# Patient Record
Sex: Female | Born: 1980 | Race: Asian | Hispanic: No | Marital: Married | Smoking: Never smoker
Health system: Southern US, Community
[De-identification: ages and names within clinical notes are randomized; demographics above are authoritative.]

## PROBLEM LIST (undated history)

## (undated) ENCOUNTER — Inpatient Hospital Stay (HOSPITAL_COMMUNITY): Payer: Self-pay

## (undated) DIAGNOSIS — Z789 Other specified health status: Secondary | ICD-10-CM

## (undated) HISTORY — PX: APPENDECTOMY: SHX54

---

## 2006-12-30 ENCOUNTER — Emergency Department (HOSPITAL_COMMUNITY): Admission: EM | Admit: 2006-12-30 | Discharge: 2006-12-30 | Payer: Self-pay | Admitting: Emergency Medicine

## 2007-09-17 ENCOUNTER — Encounter: Admission: RE | Admit: 2007-09-17 | Discharge: 2007-09-17 | Payer: Self-pay | Admitting: Internal Medicine

## 2007-11-25 ENCOUNTER — Emergency Department (HOSPITAL_COMMUNITY): Admission: EM | Admit: 2007-11-25 | Discharge: 2007-11-25 | Payer: Self-pay | Admitting: Emergency Medicine

## 2008-05-04 ENCOUNTER — Encounter: Admission: RE | Admit: 2008-05-04 | Discharge: 2008-05-04 | Payer: Self-pay | Admitting: Internal Medicine

## 2008-11-12 ENCOUNTER — Emergency Department (HOSPITAL_COMMUNITY): Admission: EM | Admit: 2008-11-12 | Discharge: 2008-11-13 | Payer: Self-pay | Admitting: Emergency Medicine

## 2010-08-13 LAB — COMPREHENSIVE METABOLIC PANEL
Alkaline Phosphatase: 23 U/L — ABNORMAL LOW (ref 39–117)
BUN: 7 mg/dL (ref 6–23)
Chloride: 109 mEq/L (ref 96–112)
Creatinine, Ser: 0.68 mg/dL (ref 0.4–1.2)
Glucose, Bld: 81 mg/dL (ref 70–99)
Potassium: 3.3 mEq/L — ABNORMAL LOW (ref 3.5–5.1)
Total Bilirubin: 1.2 mg/dL (ref 0.3–1.2)
Total Protein: 6.1 g/dL (ref 6.0–8.3)

## 2010-08-13 LAB — CULTURE, BLOOD (ROUTINE X 2)

## 2010-08-13 LAB — CBC
HCT: 34.8 % — ABNORMAL LOW (ref 36.0–46.0)
Hemoglobin: 11.8 g/dL — ABNORMAL LOW (ref 12.0–15.0)
MCV: 88.8 fL (ref 78.0–100.0)
RDW: 12.6 % (ref 11.5–15.5)

## 2010-08-13 LAB — DIFFERENTIAL
Basophils Absolute: 0 10*3/uL (ref 0.0–0.1)
Basophils Relative: 1 % (ref 0–1)
Neutro Abs: 2.7 10*3/uL (ref 1.7–7.7)
Neutrophils Relative %: 50 % (ref 43–77)

## 2011-02-16 LAB — POCT PREGNANCY, URINE
Operator id: 235561
Preg Test, Ur: NEGATIVE

## 2011-02-16 LAB — CBC
Hemoglobin: 13.2
RBC: 4.51
RDW: 11.4 — ABNORMAL LOW

## 2011-02-16 LAB — POCT URINALYSIS DIP (DEVICE)
Bilirubin Urine: NEGATIVE
Glucose, UA: NEGATIVE
Nitrite: NEGATIVE
Operator id: 116391
pH: 7

## 2011-02-16 LAB — I-STAT 8, (EC8 V) (CONVERTED LAB)
Acid-Base Excess: 2
Chloride: 102
HCT: 45
Hemoglobin: 15.3 — ABNORMAL HIGH
Potassium: 3.5
pH, Ven: 7.328 — ABNORMAL HIGH

## 2011-02-16 LAB — DIFFERENTIAL
Basophils Absolute: 0
Lymphocytes Relative: 35
Monocytes Absolute: 0.4
Neutro Abs: 3.8

## 2011-02-16 LAB — POCT I-STAT CREATININE: Operator id: 235561

## 2013-07-24 ENCOUNTER — Ambulatory Visit: Payer: BC Managed Care – PPO | Attending: Internal Medicine | Admitting: Internal Medicine

## 2013-07-24 VITALS — BP 106/73 | HR 64 | Temp 98.1°F | Resp 16 | Ht 65.0 in | Wt 130.0 lb

## 2013-07-24 DIAGNOSIS — M549 Dorsalgia, unspecified: Secondary | ICD-10-CM | POA: Diagnosis not present

## 2013-07-24 DIAGNOSIS — M545 Low back pain, unspecified: Secondary | ICD-10-CM | POA: Insufficient documentation

## 2013-07-24 DIAGNOSIS — Z008 Encounter for other general examination: Secondary | ICD-10-CM | POA: Insufficient documentation

## 2013-07-24 DIAGNOSIS — IMO0002 Reserved for concepts with insufficient information to code with codable children: Secondary | ICD-10-CM

## 2013-07-24 DIAGNOSIS — N979 Female infertility, unspecified: Secondary | ICD-10-CM

## 2013-07-24 LAB — COMPLETE METABOLIC PANEL WITH GFR
ALBUMIN: 4.2 g/dL (ref 3.5–5.2)
ALK PHOS: 29 U/L — AB (ref 39–117)
ALT: 9 U/L (ref 0–35)
AST: 14 U/L (ref 0–37)
BUN: 12 mg/dL (ref 6–23)
CALCIUM: 9 mg/dL (ref 8.4–10.5)
CHLORIDE: 101 meq/L (ref 96–112)
CO2: 27 mEq/L (ref 19–32)
Creat: 0.64 mg/dL (ref 0.50–1.10)
GFR, Est African American: >89 mL/min
GFR, Est Non African American: >89 mL/min
Glucose, Bld: 82 mg/dL (ref 70–99)
POTASSIUM: 4.2 meq/L (ref 3.5–5.3)
Sodium: 139 mEq/L (ref 135–145)
Total Bilirubin: 0.6 mg/dL (ref 0.2–1.2)
Total Protein: 6.8 g/dL (ref 6.0–8.3)

## 2013-07-24 LAB — CBC WITH DIFFERENTIAL/PLATELET
BASOS ABS: 0.1 10*3/uL (ref 0.0–0.1)
BASOS PCT: 1 % (ref 0–1)
EOS ABS: 0.1 10*3/uL (ref 0.0–0.7)
Eosinophils Relative: 1 % (ref 0–5)
HCT: 40.3 % (ref 36.0–46.0)
Hemoglobin: 13.7 g/dL (ref 12.0–15.0)
Lymphocytes Relative: 34 % (ref 12–46)
Lymphs Abs: 1.8 10*3/uL (ref 0.7–4.0)
MCH: 29.1 pg (ref 26.0–34.0)
MCHC: 34 g/dL (ref 30.0–36.0)
MCV: 85.6 fL (ref 78.0–100.0)
Monocytes Absolute: 0.3 10*3/uL (ref 0.1–1.0)
Monocytes Relative: 6 % (ref 3–12)
NEUTROS PCT: 58 % (ref 43–77)
Neutro Abs: 3.1 10*3/uL (ref 1.7–7.7)
PLATELETS: 177 10*3/uL (ref 150–400)
RBC: 4.71 MIL/uL (ref 3.87–5.11)
RDW: 13 % (ref 11.5–15.5)
WBC: 5.3 10*3/uL (ref 4.0–10.5)

## 2013-07-24 LAB — LIPID PANEL
Cholesterol: 178 mg/dL (ref 0–200)
HDL: 46 mg/dL (ref >39)
LDL CALC: 116 mg/dL — AB (ref 0–99)
Total CHOL/HDL Ratio: 3.9 Ratio
Triglycerides: 80 mg/dL (ref <150)
VLDL: 16 mg/dL (ref 0–40)

## 2013-07-24 MED ORDER — CYCLOBENZAPRINE HCL 10 MG PO TABS: 10.0000 mg | ORAL_TABLET | Freq: Every day | ORAL | Status: DC

## 2013-07-24 NOTE — Progress Notes (Signed)
Patient ID: Laura Warren, female   DOB: [birth year], 33 y.o.   MRN: [id number]   CC:  HPI: 33 year old female here to establish care. She has no known medical history. The patient has a history of an appendectomy. She has a 75-year-old daughter and stated that she has been trying to conceive for the last one year. Her menstrual cycles are regular. She does complain of some back pain which is intermittent, worse during her menstrual cycle, she did have exacerbations even when she's not menstruating. Denies any numbness or tingling in her legs. Pain is localized to her lumbar spine. She has been taking Aleve occasionally for back pain.   Patient is a nonsmoker nonalcoholic  Family history mother died of thyroid cancer, father is healthy   No Known Allergies History reviewed. No pertinent past medical history. No current outpatient prescriptions on file prior to visit.   No current facility-administered medications on file prior to visit.   Family History  Problem Relation Age of Onset  . Cancer Mother    History   Social History  . Marital Status: Married    Spouse Name: N/A    Number of Children: N/A  . Years of Education: N/A   Occupational History  . Not on file.   Social History Main Topics  . Smoking status: Never Smoker   . Smokeless tobacco: Not on file  . Alcohol Use: No  . Drug Use: No  . Sexual Activity: Yes   Other Topics Concern  . Not on file   Social History Narrative  . No narrative on file    Review of Systems  Constitutional: Negative for fever, chills, diaphoresis, activity change, appetite change and fatigue.  HENT: Negative for ear pain, nosebleeds, congestion, facial swelling, rhinorrhea, neck pain, neck stiffness and ear discharge.   Eyes: Negative for pain, discharge, redness, itching and visual disturbance.  Respiratory: Negative for cough, choking, chest tightness, shortness of breath, wheezing and stridor.   Cardiovascular: Negative for  chest pain, palpitations and leg swelling.  Gastrointestinal: Negative for abdominal distention.  Genitourinary: Negative for dysuria, urgency, frequency, hematuria, flank pain, decreased urine volume, difficulty urinating and dyspareunia.  Musculoskeletal: Negative for back pain, joint swelling, arthralgias and gait problem.  Neurological: Negative for dizziness, tremors, seizures, syncope, facial asymmetry, speech difficulty, weakness, light-headedness, numbness and headaches.  Hematological: Negative for adenopathy. Does not bruise/bleed easily.  Psychiatric/Behavioral: Negative for hallucinations, behavioral problems, confusion, dysphoric mood, decreased concentration and agitation.    Objective:   Filed Vitals:   07/24/13 0908  BP: 106/73  Pulse: 64  Temp: 98.1 F (36.7 C)  Resp: 16    Physical Exam  Constitutional: Appears well-developed and well-nourished. No distress.  HENT: Normocephalic. External right and left ear normal. Oropharynx is clear and moist.  Eyes: Conjunctivae and EOM are normal. PERRLA, no scleral icterus.  Neck: Normal ROM. Neck supple. No JVD. No tracheal deviation. No thyromegaly.  CVS: RRR, S1/S2 +, no murmurs, no gallops, no carotid bruit.  Pulmonary: Effort and breath sounds normal, no stridor, rhonchi, wheezes, rales.  Abdominal: Soft. BS +,  no distension, tenderness, rebound or guarding.  Musculoskeletal: Normal range of motion. No edema and no tenderness.  Lymphadenopathy: No lymphadenopathy noted, cervical, inguinal. Neuro: Alert. Normal reflexes, muscle tone coordination. No cranial nerve deficit. Skin: Skin is warm and dry. No rash noted. Not diaphoretic. No erythema. No pallor.  Psychiatric: Normal mood and affect. Behavior, judgment, thought content normal.   Lab Results  Component Value Date  WBC 5.4 11/12/2008   HGB 11.8* 11/12/2008   HCT 34.8* 11/12/2008   MCV 88.8 11/12/2008   PLT 125* 11/12/2008   Lab Results  Component Value Date    CREATININE 0.68 11/12/2008   BUN 7 11/12/2008   NA 140 11/12/2008   K 3.3* 11/12/2008   CL 109 11/12/2008   CO2 28 11/12/2008    No results found for this basename: HGBA1C   Lipid Panel  No results found for this basename: chol, trig, hdl, cholhdl, vldl, ldlcalc       Assessment and plan:   There are no active problems to display for this patient.  Back pain Skin clear x-ray of the back Patient advised to take Tylenol if needed for pain Flexeril as bedtime These medications are safe during pregnancy, in case she gets pregnant  Establish care Gynecology referral for infertility and Pap smear Baseline labs Patient states that she received tetanus in 2008 Flu Vaccine last year  Followup in 3 months       The patient was given clear instructions to go to ER or return to medical center if symptoms don't improve, worsen or new problems develop. The patient verbalized understanding. The patient was told to call to get any lab results if not heard anything in the next week.

## 2013-07-24 NOTE — Progress Notes (Signed)
Pt us here to establish care. Pt is requesting a full check up. For a year pt has been trying to get pregnant. She get back pain and shoulder pain when she is having her menstrual cycle.

## 2013-07-25 LAB — VITAMIN D 25 HYDROXY (VIT D DEFICIENCY, FRACTURES): Vit D, 25-Hydroxy: 23 ng/mL — ABNORMAL LOW (ref 30–89)

## 2013-07-25 LAB — PROLACTIN: PROLACTIN: 10.2 ng/mL

## 2013-07-25 LAB — TSH: TSH: 1.066 u[IU]/mL (ref 0.350–4.500)

## 2013-07-25 LAB — T4, FREE: FREE T4: 1.39 ng/dL (ref 0.80–1.80)

## 2013-08-03 ENCOUNTER — Telehealth: Payer: Self-pay | Admitting: Emergency Medicine

## 2013-08-03 NOTE — Telephone Encounter (Signed)
Message copied by Darlis LoanSMITH, JILL D on Mon Aug 03, 2013  5:20 PM ------      Message from: Susie CassetteABROL MD, Germain OsgoodNAYANA      Created: Wed Jul 29, 2013  4:10 PM       Notify patient that labs are normal, vitamin D level slightly low. Recommend vitamin D. 2000 international units over-the-counter twice a day ------

## 2013-08-03 NOTE — Telephone Encounter (Signed)
Left message for pt to call clinic

## 2013-11-26 ENCOUNTER — Ambulatory Visit (HOSPITAL_COMMUNITY)
Admission: RE | Admit: 2013-11-26 | Discharge: 2013-11-26 | Disposition: A | Discharge status: Home / Self Care | Payer: Worker's Compensation | Source: Ambulatory Visit | Attending: Internal Medicine | Admitting: Internal Medicine

## 2013-11-26 ENCOUNTER — Other Ambulatory Visit (HOSPITAL_COMMUNITY): Payer: Self-pay | Admitting: Internal Medicine

## 2013-11-26 DIAGNOSIS — S0990XA Unspecified injury of head, initial encounter: Secondary | ICD-10-CM | POA: Insufficient documentation

## 2013-11-26 DIAGNOSIS — R42 Dizziness and giddiness: Secondary | ICD-10-CM | POA: Insufficient documentation

## 2013-11-26 DIAGNOSIS — X58XXXA Exposure to other specified factors, initial encounter: Secondary | ICD-10-CM | POA: Insufficient documentation

## 2013-11-26 DIAGNOSIS — R51 Headache: Secondary | ICD-10-CM | POA: Insufficient documentation

## 2014-09-03 ENCOUNTER — Encounter: Payer: Self-pay | Admitting: Family Medicine

## 2014-09-03 ENCOUNTER — Ambulatory Visit: Payer: PRIVATE HEALTH INSURANCE | Attending: Family Medicine | Admitting: Family Medicine

## 2014-09-03 VITALS — BP 99/62 | HR 72 | Temp 98.3°F | Resp 16 | Ht 65.0 in | Wt 132.0 lb

## 2014-09-03 DIAGNOSIS — M545 Low back pain, unspecified: Secondary | ICD-10-CM

## 2014-09-03 DIAGNOSIS — N979 Female infertility, unspecified: Secondary | ICD-10-CM | POA: Insufficient documentation

## 2014-09-03 MED ORDER — DICLOFENAC SODIUM 1 % TD GEL: 2.0000 g | Freq: Four times a day (QID) | TRANSDERMAL | Status: DC

## 2014-09-03 MED ORDER — GABAPENTIN 300 MG PO CAPS: 300.0000 mg | ORAL_CAPSULE | Freq: Every day | ORAL | Status: DC

## 2014-09-03 NOTE — Progress Notes (Signed)
Establish Care Complaining  of back pain Question about infertility

## 2014-09-03 NOTE — Patient Instructions (Addendum)
Mrs. Laura Warren,   1. Back pain: I suspect L 4 nerve root irritation related to joint space narrowing Plan: Evaluate with x-ray  Diclofenac- antiinflammatory gel as oral NSAIDs are not recommended in pregnancy  Gabapentin 300 mg at night Physical therapy    2. Trouble conceiving: With a regular 28 day cycle be sure to have sex on your fertile days   F/u in 4-6 weeks for full physical with pap, 30 minute visit   Dr. Armen PickupFunches

## 2014-09-03 NOTE — Progress Notes (Signed)
   Subjective:    Patient ID: Laura Warren, female    DOB: 01/01/1981, 34 y.o.   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 Trouble conceiving: has one child. Would like another child. Trying now for 2 years with her husband. Has periods q 28 days. No contraception for many years, pull out method only. Husband is healthy. Has sex 2-3 times per week.   Soc Hx: non smoker Med Hx: none Surg Hx; appendectomy in 1996 Review of Systems  Constitutional: Negative.   Genitourinary: Negative for menstrual problem.  Musculoskeletal: Positive for back pain.  Allergic/Immunologic: Negative.   Psychiatric/Behavioral: Negative.        Objective:   Physical Exam BP 99/62 mmHg  Pulse 72  Temp(Src) 98.3 F (36.8 C) (Oral)  Resp 16  Ht 5\' 5"  (1.651 m)  Wt 132 lb (59.875 kg)  BMI 21.97 kg/m2  SpO2 99%  LMP 09/01/2014 General appearance: alert, cooperative and no distress Lungs: clear to auscultation bilaterally Heart: regular rate and rhythm, S1, S2 normal, no murmur, click, rub or gallop  Back Exam: Back: Normal Curvature, no deformities or CVA tenderness  Paraspinal Tenderness: b/l L5  LE Strength 5/5  LE Sensation: in tact  LE Reflexes 2+ and symmetric  Straight leg raise: negative          Assessment & Plan:

## 2014-09-04 ENCOUNTER — Encounter: Payer: Self-pay | Admitting: Family Medicine

## 2014-09-04 NOTE — Assessment & Plan Note (Signed)
A:  Trouble conceiving: after 2 yrs of trying with regular periods P: With a regular 28 day cycle be sure to have sex on your fertile days,fertility wheel provided Pelvic exam at f/u

## 2014-09-04 NOTE — Assessment & Plan Note (Signed)
A: Back pain: I suspect L 4 nerve root irritation related to joint space narrowing Plan: Evaluate with x-ray  Diclofenac- antiinflammatory gel as oral NSAIDs are not recommended in pregnancy  Gabapentin 300 mg at night Physical therapy

## 2015-07-26 ENCOUNTER — Encounter: Payer: Self-pay | Admitting: Family Medicine

## 2015-07-26 ENCOUNTER — Other Ambulatory Visit (HOSPITAL_COMMUNITY)
Admission: RE | Admit: 2015-07-26 | Discharge: 2015-07-26 | Disposition: A | Discharge status: Home / Self Care | Payer: PRIVATE HEALTH INSURANCE | Source: Ambulatory Visit | Attending: Family Medicine | Admitting: Family Medicine

## 2015-07-26 ENCOUNTER — Ambulatory Visit: Payer: PRIVATE HEALTH INSURANCE | Attending: Family Medicine | Admitting: Family Medicine

## 2015-07-26 VITALS — BP 109/72 | HR 74 | Temp 98.3°F | Resp 16 | Ht 65.0 in | Wt 129.0 lb

## 2015-07-26 DIAGNOSIS — N760 Acute vaginitis: Secondary | ICD-10-CM | POA: Diagnosis not present

## 2015-07-26 DIAGNOSIS — B9689 Other specified bacterial agents as the cause of diseases classified elsewhere: Secondary | ICD-10-CM

## 2015-07-26 DIAGNOSIS — N979 Female infertility, unspecified: Secondary | ICD-10-CM

## 2015-07-26 DIAGNOSIS — Z01419 Encounter for gynecological examination (general) (routine) without abnormal findings: Secondary | ICD-10-CM | POA: Insufficient documentation

## 2015-07-26 DIAGNOSIS — Z1151 Encounter for screening for human papillomavirus (HPV): Secondary | ICD-10-CM | POA: Diagnosis present

## 2015-07-26 DIAGNOSIS — Z124 Encounter for screening for malignant neoplasm of cervix: Secondary | ICD-10-CM | POA: Diagnosis present

## 2015-07-26 DIAGNOSIS — A499 Bacterial infection, unspecified: Secondary | ICD-10-CM | POA: Diagnosis not present

## 2015-07-26 DIAGNOSIS — Z113 Encounter for screening for infections with a predominantly sexual mode of transmission: Secondary | ICD-10-CM | POA: Diagnosis present

## 2015-07-26 NOTE — Progress Notes (Signed)
Annual gyn  No vaginal discharge no odor  sexually active, no pain with intercourse  No tobacco user  No suicidal thoughts in the past two weeks

## 2015-07-26 NOTE — Patient Instructions (Addendum)
Laura Warren was seen today for annual exam.  Diagnoses and all orders for this visit:  Pap smear for cervical cancer screening -     Cytology - PAP  Inability to conceive, female -     Cancel: FSH/LH; Future -     Cancel: Estradiol; Future -     Ambulatory referral to Endocrinology   F/u in 6 months for wellness  You will be contacted about referral to reproductive endocrinologist  Dr. Armen PickupFunches

## 2015-07-26 NOTE — Progress Notes (Signed)
SUBJECTIVE:  35 y.o. female for annual routine Pap and checkup.  1. Inability to conceive: trying now for 3 years. Has one daughter with her husband. She is 189 yrs old. Patient conceived right away.  Periods are lighter and shorter, 2 days. However the interval between periods has been regular.   Current Outpatient Prescriptions  Medication Sig Dispense Refill  . diclofenac sodium (VOLTAREN) 1 % GEL Apply 2 g topically 4 (four) times daily. (Patient not taking: Reported on 07/26/2015) 100 g 3  . gabapentin (NEURONTIN) 300 MG capsule Take 1 capsule (300 mg total) by mouth at bedtime. (Patient not taking: Reported on 07/26/2015) 30 capsule 1   No current facility-administered medications for this visit.   Allergies: Review of patient's allergies indicates no known allergies.  Patient's last menstrual period was 07/19/2015.  ROS:  Feeling well. No dyspnea or chest pain on exertion.  No abdominal pain, change in bowel habits, black or bloody stools.  No urinary tract symptoms. GYN ROS: normal menses, no abnormal bleeding, pelvic pain or discharge, no breast pain or new or enlarging lumps on self exam, no side effects of hormonal medications, no vaginal bleeding, no discharge or pelvic pain. No neurological complaints.  OBJECTIVE:  The patient appears well, alert, oriented x 3, in no distress. BP 109/72 mmHg  Pulse 74  Temp(Src) 98.3 F (36.8 C) (Oral)  Resp 16  Ht 5\' 5"  (1.651 m)  Wt 129 lb (58.514 kg)  BMI 21.47 kg/m2  SpO2 99%  LMP 07/19/2015 ENT normal.  Neck supple. No adenopathy or thyromegaly. PERLA. Lungs are clear, good air entry, no wheezes, rhonchi or rales. S1 and S2 normal, no murmurs, regular rate and rhythm. Abdomen soft without tenderness, guarding, mass or organomegaly. Extremities show no edema, normal peripheral pulses. Neurological is normal, no focal findings.  BREAST EXAM: breasts appear normal, no suspicious masses, no skin or nipple changes or axillary nodes  PELVIC  EXAM: normal external genitalia, vulva, vagina, anterior lip of cervix is friable, uterus and adnexa normal  ASSESSMENT:  well woman  PLAN:  pap smear Referred to reproductive endocrinologist

## 2015-07-27 LAB — CERVICOVAGINAL ANCILLARY ONLY
CHLAMYDIA, DNA PROBE: NEGATIVE
NEISSERIA GONORRHEA: NEGATIVE

## 2015-07-28 ENCOUNTER — Encounter: Payer: Self-pay | Admitting: Clinical

## 2015-07-28 LAB — CYTOLOGY - PAP

## 2015-07-28 NOTE — Progress Notes (Signed)
[facility] 07/26/2015 09/03/2014  Decreased Interest 1 0  Down, Depressed, Hopeless 0 0  PHQ - 2 Score 1 0  Altered sleeping 1 -  Tired, decreased energy 0 -  Change in appetite 0 -  Feeling bad or failure about yourself  0 -  Trouble concentrating 0 -  Moving slowly or fidgety/restless 0 -  Suicidal thoughts 0 -  PHQ-9 Score 2 -    GAD 7 : Generalized Anxiety Score 07/26/2015  Nervous, Anxious, on Edge 0  Control/stop worrying 0  Worry too much - different things 1  Trouble relaxing 0  Restless 0  Easily annoyed or irritable 0  Afraid - awful might happen 0  Total GAD 7 Score 1

## 2015-08-01 LAB — CERVICOVAGINAL ANCILLARY ONLY: WET PREP (BD AFFIRM): POSITIVE — AB

## 2015-08-01 MED ORDER — METRONIDAZOLE 500 MG PO TABS: 500.0000 mg | ORAL_TABLET | Freq: Two times a day (BID) | ORAL | Status: AC

## 2015-08-01 MED ORDER — FLUCONAZOLE 150 MG PO TABS: 150.0000 mg | ORAL_TABLET | Freq: Once | ORAL | Status: DC

## 2015-08-01 NOTE — Addendum Note (Signed)
Addended by: Dessa PhiFUNCHES, Khristopher Kapaun on: 08/01/2015 02:06 PM   Modules accepted: Orders, SmartSet

## 2015-08-04 ENCOUNTER — Telehealth: Payer: Self-pay | Admitting: *Deleted

## 2015-08-04 NOTE — Telephone Encounter (Signed)
-----   Message from Dessa PhiJosalyn Funches, MD sent at 07/28/2015  2:47 PM EDT ----- Negative pap that is HPV negative This is normal Repeat in 5 years

## 2015-08-04 NOTE — Telephone Encounter (Signed)
Pt return call  Date of birth verified by pt Lab results given  Normal Pap, Gc/ chlam negative BV on wet  Rx send to [facility]  Pt verbalized understanding

## 2015-08-04 NOTE — Telephone Encounter (Signed)
-----   Message from Dessa PhiJosalyn Funches, MD sent at 08/01/2015  2:05 PM EDT ----- BV on wet prep Bacterial vaginosis (BV) on wet prep This is not an STD This is an overgrowth of bacteria that can be treated with flagyl 500 mg twice daily followed by difucan 150 mg once to prevent yeast.  I have sent these to your pharmacy Do not mix flagyl with alcohol as this will cause stomach upset

## 2015-08-04 NOTE — Telephone Encounter (Signed)
LVM to return call.

## 2015-08-04 NOTE — Telephone Encounter (Signed)
-----   Message from Dessa PhiJosalyn Funches, MD sent at 07/28/2015 10:27 AM EDT ----- Negative screening GC/chlam

## 2015-09-20 ENCOUNTER — Telehealth: Payer: Self-pay | Admitting: Family Medicine

## 2015-09-20 DIAGNOSIS — N979 Female infertility, unspecified: Secondary | ICD-10-CM

## 2015-09-20 NOTE — Telephone Encounter (Signed)
Referral was placed on 3.21 . 2017 to gyn who recommended reproductive gyn There is no option for reproductive gyn referral in [facility], I have placed another gyn referral

## 2015-09-20 NOTE — Telephone Encounter (Signed)
Patient is following up regarding referral to specialist for fertility concerns  Patient states that it has been over two months and she has not heard back from anyone  Please follow up

## 2015-11-11 ENCOUNTER — Telehealth: Payer: Self-pay | Admitting: Family Medicine

## 2015-11-11 DIAGNOSIS — Z3490 Encounter for supervision of normal pregnancy, unspecified, unspecified trimester: Secondary | ICD-10-CM

## 2015-11-11 NOTE — Telephone Encounter (Signed)
Patient dropped off paperwork for FMLA from job. Patient states that she is two months pregnant and experiencing a lot of nausea, vomiting and is unable to lift. Please follow up. Patient needs paperwork as soon as possible.

## 2015-11-11 NOTE — Telephone Encounter (Signed)
Will forward message to Dr. Armen PickupFunches to see what she advise and see what's the status of the paperwork

## 2015-11-14 ENCOUNTER — Encounter (HOSPITAL_COMMUNITY): Payer: Self-pay | Admitting: *Deleted

## 2015-11-14 ENCOUNTER — Encounter: Payer: Self-pay | Admitting: Family Medicine

## 2015-11-14 ENCOUNTER — Inpatient Hospital Stay (HOSPITAL_COMMUNITY): Payer: Medicaid Other

## 2015-11-14 ENCOUNTER — Inpatient Hospital Stay (HOSPITAL_COMMUNITY)
Admission: AD | Admit: 2015-11-14 | Discharge: 2015-11-14 | Disposition: A | Discharge status: Home / Self Care | Payer: Medicaid Other | Source: Ambulatory Visit | Attending: Obstetrics & Gynecology | Admitting: Obstetrics & Gynecology

## 2015-11-14 DIAGNOSIS — R109 Unspecified abdominal pain: Secondary | ICD-10-CM | POA: Diagnosis not present

## 2015-11-14 DIAGNOSIS — Z3491 Encounter for supervision of normal pregnancy, unspecified, first trimester: Secondary | ICD-10-CM

## 2015-11-14 DIAGNOSIS — Z3490 Encounter for supervision of normal pregnancy, unspecified, unspecified trimester: Secondary | ICD-10-CM | POA: Insufficient documentation

## 2015-11-14 DIAGNOSIS — R1031 Right lower quadrant pain: Secondary | ICD-10-CM | POA: Diagnosis present

## 2015-11-14 DIAGNOSIS — Z3A09 9 weeks gestation of pregnancy: Secondary | ICD-10-CM | POA: Insufficient documentation

## 2015-11-14 DIAGNOSIS — O26899 Other specified pregnancy related conditions, unspecified trimester: Secondary | ICD-10-CM

## 2015-11-14 DIAGNOSIS — O9989 Other specified diseases and conditions complicating pregnancy, childbirth and the puerperium: Secondary | ICD-10-CM

## 2015-11-14 DIAGNOSIS — O26891 Other specified pregnancy related conditions, first trimester: Secondary | ICD-10-CM | POA: Diagnosis not present

## 2015-11-14 DIAGNOSIS — O219 Vomiting of pregnancy, unspecified: Secondary | ICD-10-CM

## 2015-11-14 LAB — COMPREHENSIVE METABOLIC PANEL
ALBUMIN: 3.7 g/dL (ref 3.5–5.0)
ALT: 13 U/L — AB (ref 14–54)
AST: 19 U/L (ref 15–41)
Alkaline Phosphatase: 26 U/L — ABNORMAL LOW (ref 38–126)
Anion gap: 6 (ref 5–15)
BUN: 8 mg/dL (ref 6–20)
CHLORIDE: 103 mmol/L (ref 101–111)
CO2: 23 mmol/L (ref 22–32)
CREATININE: 0.62 mg/dL (ref 0.44–1.00)
Calcium: 8.8 mg/dL — ABNORMAL LOW (ref 8.9–10.3)
GFR calc Af Amer: >60 mL/min (ref >60)
GFR calc non Af Amer: >60 mL/min (ref >60)
GLUCOSE: 100 mg/dL — AB (ref 65–99)
POTASSIUM: 3.6 mmol/L (ref 3.5–5.1)
SODIUM: 132 mmol/L — AB (ref 135–145)
Total Bilirubin: 0.5 mg/dL (ref 0.3–1.2)
Total Protein: 6.9 g/dL (ref 6.5–8.1)

## 2015-11-14 LAB — CBC
HEMATOCRIT: 35.3 % — AB (ref 36.0–46.0)
HEMOGLOBIN: 12 g/dL (ref 12.0–15.0)
MCH: 28 pg (ref 26.0–34.0)
MCHC: 34 g/dL (ref 30.0–36.0)
MCV: 82.5 fL (ref 78.0–100.0)
Platelets: 140 10*3/uL — ABNORMAL LOW (ref 150–400)
RBC: 4.28 MIL/uL (ref 3.87–5.11)
RDW: 12.1 % (ref 11.5–15.5)
WBC: 6.8 10*3/uL (ref 4.0–10.5)

## 2015-11-14 LAB — URINALYSIS, ROUTINE W REFLEX MICROSCOPIC
BILIRUBIN URINE: NEGATIVE
GLUCOSE, UA: NEGATIVE mg/dL
Ketones, ur: 15 mg/dL — AB
Nitrite: NEGATIVE
Protein, ur: NEGATIVE mg/dL
SPECIFIC GRAVITY, URINE: 1.01 (ref 1.005–1.030)
pH: 6 (ref 5.0–8.0)

## 2015-11-14 LAB — ABO/RH: ABO/RH(D): O POS

## 2015-11-14 LAB — POCT PREGNANCY, URINE: PREG TEST UR: POSITIVE — AB

## 2015-11-14 LAB — HCG, QUANTITATIVE, PREGNANCY: HCG, BETA CHAIN, QUANT, S: 167173 m[IU]/mL — AB (ref <5)

## 2015-11-14 LAB — URINE MICROSCOPIC-ADD ON

## 2015-11-14 MED ORDER — LACTATED RINGERS IV BOLUS (SEPSIS)
1000.0000 mL | Freq: Once | INTRAVENOUS | Status: AC
  Administered 2015-11-14: 1000 mL via INTRAVENOUS

## 2015-11-14 MED ORDER — PROMETHAZINE HCL 25 MG/ML IJ SOLN
25.0000 mg | Freq: Once | INTRAMUSCULAR | Status: AC
  Administered 2015-11-14: 25 mg via INTRAVENOUS
  Filled 2015-11-14: qty 1

## 2015-11-14 MED ORDER — PROMETHAZINE HCL 12.5 MG PO TABS: 12.5000 mg | ORAL_TABLET | Freq: Four times a day (QID) | ORAL | Status: DC | PRN

## 2015-11-14 NOTE — MAU Note (Addendum)
Has been vomiting. Taking meds, not really working. Can eat nothing.  Pain in RLQ, off and on past couple days.  Had low grade fever 99.  Having dizziness this morning.

## 2015-11-14 NOTE — MAU Provider Note (Signed)
History     CSN: [id number]  Arrival date and time: 11/14/15 1217   First Provider Initiated Contact with Patient 11/14/15 1532      Chief Complaint  Patient presents with  . Abdominal Pain  . Emesis   HPI Laura Warren is a 35 y.o. G2P1001 at [redacted]w[redacted]d who presents with abdominal cramping & n/v. Reports n/v for the last week; thought she was taking antiemetic but was really taking 2 different brands of prenatal vitamins. Reports vomiting countless times today & states she hasn't been able to keep anything down today. Also reports RLQ abdominal pain for the last few days. Pain comes & goes. Pt thinks pain is due to tensing up when she vomits & coughs. States had temp today of 99; no meds.  Denies vaginal bleeding or vaginal discharge.  Requesting FMLA papers be filled out today so she can be out of work for 1 month during her nausea/vomiting.   OB History    Gravida Para Term Preterm AB TAB SAB Ectopic Multiple Living   0 0 0 0 0 0 1      History reviewed. No pertinent past medical history.  Past Surgical History  Procedure Laterality Date  . Appendectomy      Family History  Problem Relation Age of Onset  . Cancer Mother     Social History  Substance Use Topics  . Smoking status: Never Smoker   . Smokeless tobacco: None  . Alcohol Use: No    Allergies: No Known Allergies  Prescriptions prior to admission  Medication Sig Dispense Refill Last Dose  . Prenatal Vit-Fe Fumarate-FA (PRENATAL MULTIVITAMIN) TABS tablet Take 1 tablet by mouth daily at 12 noon.   11/13/2015 at Unknown time  . fluconazole (DIFLUCAN) 150 MG tablet Take 1 tablet (150 mg total) by mouth once. (Patient not taking: Reported on 11/14/2015) 1 tablet 0     Review of Systems  Constitutional: Positive for fever (highest temp 99 orally). Negative for chills.  HENT: Positive for congestion. Negative for ear pain.   Respiratory: Positive for cough. Negative for sputum production, shortness of breath  and wheezing.   Gastrointestinal: Positive for nausea, vomiting and abdominal pain. Negative for heartburn, diarrhea and constipation.  Genitourinary: Negative.   Neurological: Positive for weakness. Negative for headaches.   Physical Exam   Blood pressure 106/65, pulse 86, temperature 98.3 F (36.8 C), temperature source Oral, resp. rate 18, height  (1.6 m), weight 126 lb 3.2 oz (57.244 kg), last menstrual period 09/12/2015, unknown if currently breastfeeding.  Physical Exam  Nursing note and vitals reviewed. Constitutional: She is oriented to person, place, and time. She appears well-developed and well-nourished. No distress.  HENT:  Head: Normocephalic and atraumatic.  Eyes: Conjunctivae are normal. Right eye exhibits no discharge. Left eye exhibits no discharge. No scleral icterus.  Neck: Normal range of motion.  Cardiovascular: Normal rate, regular rhythm and normal heart sounds.   No murmur heard. Respiratory: Effort normal and breath sounds normal. No respiratory distress. She has no wheezes.  GI: Soft. Bowel sounds are normal. She exhibits no distension. There is no tenderness. There is no rebound and no guarding.  Genitourinary:  Pt declined exam  Neurological: She is alert and oriented to person, place, and time.  Skin: Skin is warm and dry. She is not diaphoretic.  Psychiatric: She has a normal mood and affect. Her behavior is normal. Judgment and thought content normal.    MAU Course  Procedures  Results for orders placed or performed during the hospital encounter of 11/14/15 (from the past 24 hour(s))  Urinalysis, Routine w reflex microscopic ([facility])     Status: Abnormal   Collection Time: 11/14/15 12:40 PM  Result Value Ref Range   Color, Urine YELLOW YELLOW   APPearance CLEAR CLEAR   Specific Gravity, Urine 1.010 1.005 - 1.030   pH 6.0 5.0 - 8.0   Glucose, UA NEGATIVE NEGATIVE mg/dL   Hgb urine dipstick TRACE (A) NEGATIVE   Bilirubin Urine NEGATIVE  NEGATIVE   Ketones, ur 15 (A) NEGATIVE mg/dL   Protein, ur NEGATIVE NEGATIVE mg/dL   Nitrite NEGATIVE NEGATIVE   Leukocytes, UA TRACE (A) NEGATIVE  Urine microscopic-add on     Status: Abnormal   Collection Time: 11/14/15 12:40 PM  Result Value Ref Range   Squamous Epithelial / LPF 0-5 (A) NONE SEEN   WBC, UA 0-5 0 - 5 WBC/hpf   RBC / HPF 0-5 0 - 5 RBC/hpf   Bacteria, UA RARE (A) NONE SEEN  Pregnancy, urine POC     Status: Abnormal   Collection Time: 11/14/15 12:46 PM  Result Value Ref Range   Preg Test, Ur POSITIVE (A) NEGATIVE  CBC     Status: Abnormal   Collection Time: 11/14/15  1:42 PM  Result Value Ref Range   WBC 6.8 4.0 - 10.5 K/uL   RBC 4.28 3.87 - 5.11 MIL/uL   Hemoglobin 12.0 12.0 - 15.0 g/dL   HCT 28.435.3 (L) 13.236.0 - 44.046.0 %   MCV 82.5 78.0 - 100.0 fL   MCH 28.0 26.0 - 34.0 pg   MCHC 34.0 30.0 - 36.0 g/dL   RDW 10.212.1 72.511.5 - 36.615.5 %   Platelets 140 (L) 150 - 400 K/uL  ABO/Rh     Status: None   Collection Time: 11/14/15  1:42 PM  Result Value Ref Range   ABO/RH(D) O POS   Comprehensive metabolic panel     Status: Abnormal   Collection Time: 11/14/15  1:42 PM  Result Value Ref Range   Sodium 132 (L) 135 - 145 mmol/L   Potassium 3.6 3.5 - 5.1 mmol/L   Chloride 103 101 - 111 mmol/L   CO2 23 22 - 32 mmol/L   Glucose, Bld 100 (H) 65 - 99 mg/dL   BUN 8 6 - 20 mg/dL   Creatinine, Ser 4.400.62 0.44 - 1.00 mg/dL   Calcium 8.8 (L) 8.9 - 10.3 mg/dL   Total Protein 6.9 6.5 - 8.1 g/dL   Albumin 3.7 3.5 - 5.0 g/dL   AST 19 15 - 41 U/L   ALT 13 (L) 14 - 54 U/L   Alkaline Phosphatase 26 (L) 38 - 126 U/L   Total Bilirubin 0.5 0.3 - 1.2 mg/dL   GFR calc non Af Amer >60 >60 mL/min   GFR calc Af Amer >60 >60 mL/min   Anion gap 6 5 - 15  hCG, quantitative, pregnancy     Status: Abnormal   Collection Time: 11/14/15  1:43 PM  Result Value Ref Range   hCG, Beta Chain, Quant, S [id number] (H) <5 mIU/mL    MDM O positive Ultrasound shows SIUP with cardiac activity, Right CLC Afebrile,  VSS CBC- no leukocytosis Urinalysis shows 15 ketones & 1.010 SG  IV fluids x 1 bag of LR Phenergan 25 mg IV Discussed with patient that we do not do FMLA paperwork through the MAU & she would need to have her ob/gyn fill it out  for her Assessment and Plan  A: 1. Normal IUP (intrauterine pregnancy) on prenatal ultrasound, first trimester   2. Abdominal pain affecting pregnancy   3. Nausea and vomiting during pregnancy prior to [redacted] weeks gestation     P; Discharge home Rx phenergan Start prenatal care Discontinue one of her prenatal vitamins Discussed reasons to return to MAU  Judeth Horn 11/14/2015, 3:31 PM

## 2015-11-14 NOTE — Discharge Instructions (Signed)
Morning Sickness Morning sickness is when you feel sick to your stomach (nauseous) during pregnancy. This nauseous feeling may or may not come with vomiting. It often occurs in the morning but can be a problem any time of day. Morning sickness is most common during the first trimester, but it may continue throughout pregnancy. While morning sickness is unpleasant, it is usually harmless unless you develop severe and continual vomiting (hyperemesis gravidarum). This condition requires more intense treatment.  CAUSES  The cause of morning sickness is not completely known but seems to be related to normal hormonal changes that occur in pregnancy. RISK FACTORS You are at greater risk if you:  Experienced nausea or vomiting before your pregnancy.  Had morning sickness during a previous pregnancy.  Are pregnant with more than one baby, such as twins. TREATMENT  Do not use any medicines (prescription, over-the-counter, or herbal) for morning sickness without first talking to your health care provider. Your health care provider may prescribe or recommend:  Vitamin B6 supplements.  Anti-nausea medicines.  The herbal medicine ginger. HOME CARE INSTRUCTIONS   Only take over-the-counter or prescription medicines as directed by your health care provider.  Taking multivitamins before getting pregnant can prevent or decrease the severity of morning sickness in most women.  Eat a piece of dry toast or unsalted crackers before getting out of bed in the morning.  Eat five or six small meals a day.  Eat dry and bland foods (rice, baked potato). Foods high in carbohydrates are often helpful.  Do not drink liquids with your meals. Drink liquids between meals.  Avoid greasy, fatty, and spicy foods.  Get someone to cook for you if the smell of any food causes nausea and vomiting.  If you feel nauseous after taking prenatal vitamins, take the vitamins at night or with a snack.  Snack on protein  foods (nuts, yogurt, cheese) between meals if you are hungry.  Eat unsweetened gelatins for desserts.  Wearing an acupressure wristband (worn for sea sickness) may be helpful.  Acupuncture may be helpful.  Do not smoke.  Get a humidifier to keep the air in your house free of odors.  Get plenty of fresh air. SEEK MEDICAL CARE IF:   Your home remedies are not working, and you need medicine.  You feel dizzy or lightheaded.  You are losing weight. SEEK IMMEDIATE MEDICAL CARE IF:   You have persistent and uncontrolled nausea and vomiting.  You pass out (faint). MAKE SURE YOU:  Understand these instructions.  Will watch your condition.  Will get help right away if you are not doing well or get worse.   This information is not intended to replace advice given to you by your health care provider. Make sure you discuss any questions you have with your health care provider.   Document Released: 06/14/2006 Document Revised: 04/28/2013 Document Reviewed: 10/08/2012 Elsevier Interactive Patient Education 2016 Else[employer] Inc[employer]   Abdominal Pain During Pregnancy Belly (abdominal) pain is common during pregnancy. Most of the time, it is not a serious problem. Other times, it can be a sign that something is wrong with the pregnancy. Always tell your doctor if you have belly pain. HOME CARE Monitor your belly pain for any changes. The following actions may help you feel better:  Do not have sex (intercourse) or put anything in your vagina until you feel better.  Rest until your pain stops.  Drink clear fluids if you feel sick to your stomach (nauseous). Do not eat  solid food until you feel better.  Only take medicine as told by your doctor.  Keep all doctor visits as told. GET HELP RIGHT AWAY IF:   You are bleeding, leaking fluid, or pieces of tissue come out of your vagina.  You have more pain or cramping.  You keep throwing up (vomiting).  You have pain when you pee  (urinate) or have blood in your pee.  You have a fever.  You do not feel your baby moving as much.  You feel very weak or feel like passing out.  You have trouble breathing, with or without belly pain.  You have a very bad headache and belly pain.  You have fluid leaking from your vagina and belly pain.  You keep having watery poop (diarrhea).  Your belly pain does not go away after resting, or the pain gets worse. MAKE SURE YOU:   Understand these instructions.  Will watch your condition.  Will get help right away if you are not doing well or get worse.   This information is not intended to replace advice given to you by your health care provider. Make sure you discuss any questions you have with your health care provider.   Document Released: 04/11/2009 Document Revised: 12/24/2012 Document Reviewed: 11/20/2012 Elsevier Interactive Patient Education [employer]

## 2015-11-14 NOTE — Telephone Encounter (Signed)
Patient's FMLA paperwork is completed She is advised to establish care with OB at health department or office of her choice.  Any specific leave will be determined by her OB after she has established care and been evaluated.  We do not provide pregnancy care here.   She is advised to use vitamin B6 25 mg three times daily for nausea and vomiting. If symptoms continue she is to add unisom 12.5 mg (1/2 tablet) at bedtime.

## 2015-11-14 NOTE — Telephone Encounter (Signed)
RN advised patient: Patient's FMLA paperwork is completed. She is advised to establish care with OB at health department or office of her choice. Any specific leave will be determined by her OB after she has established care and been evaluated. We do not provide pregnancy care here.  She is advised to use vitamin B6 25 mg three times daily for nausea and vomiting. If symptoms continue she is to add unisom 12.5 mg (1/2 tablet) at bedtime. Pollyann KennedyKim Becton, RN, BSN

## 2015-11-15 LAB — HIV ANTIBODY (ROUTINE TESTING W REFLEX): HIV SCREEN 4TH GENERATION: NONREACTIVE

## 2015-12-05 LAB — OB RESULTS CONSOLE RPR: RPR: NONREACTIVE

## 2015-12-05 LAB — OB RESULTS CONSOLE HEPATITIS B SURFACE ANTIGEN: Hepatitis B Surface Ag: NEGATIVE

## 2015-12-05 LAB — OB RESULTS CONSOLE RUBELLA ANTIBODY, IGM: Rubella: IMMUNE

## 2015-12-05 LAB — OB RESULTS CONSOLE ABO/RH: RH Type: POSITIVE

## 2015-12-05 LAB — OB RESULTS CONSOLE GC/CHLAMYDIA
CHLAMYDIA, DNA PROBE: NEGATIVE
GC PROBE AMP, GENITAL: NEGATIVE

## 2015-12-05 LAB — OB RESULTS CONSOLE HIV ANTIBODY (ROUTINE TESTING): HIV: NONREACTIVE

## 2015-12-05 LAB — OB RESULTS CONSOLE ANTIBODY SCREEN: ANTIBODY SCREEN: NEGATIVE

## 2015-12-06 ENCOUNTER — Other Ambulatory Visit (HOSPITAL_COMMUNITY): Payer: Self-pay | Admitting: Physician Assistant

## 2015-12-06 ENCOUNTER — Encounter (HOSPITAL_COMMUNITY): Payer: Self-pay | Admitting: Physician Assistant

## 2015-12-06 DIAGNOSIS — Z3682 Encounter for antenatal screening for nuchal translucency: Secondary | ICD-10-CM

## 2015-12-06 DIAGNOSIS — Z3A13 13 weeks gestation of pregnancy: Secondary | ICD-10-CM

## 2015-12-16 ENCOUNTER — Encounter (HOSPITAL_COMMUNITY): Payer: Self-pay

## 2015-12-16 ENCOUNTER — Ambulatory Visit (HOSPITAL_COMMUNITY): Admission: RE | Admit: 2015-12-16 | Payer: Medicaid Other | Source: Ambulatory Visit

## 2015-12-16 ENCOUNTER — Ambulatory Visit (HOSPITAL_COMMUNITY)
Admission: RE | Admit: 2015-12-16 | Discharge: 2015-12-16 | Disposition: A | Discharge status: Home / Self Care | Payer: Medicaid Other | Source: Ambulatory Visit | Attending: Physician Assistant | Admitting: Physician Assistant

## 2015-12-16 VITALS — BP 96/66 | HR 71 | Wt 128.4 lb

## 2015-12-16 DIAGNOSIS — Z36 Encounter for antenatal screening of mother: Secondary | ICD-10-CM | POA: Diagnosis not present

## 2015-12-16 DIAGNOSIS — Z3A13 13 weeks gestation of pregnancy: Secondary | ICD-10-CM | POA: Diagnosis not present

## 2015-12-16 DIAGNOSIS — Z3682 Encounter for antenatal screening for nuchal translucency: Secondary | ICD-10-CM

## 2015-12-16 DIAGNOSIS — O09529 Supervision of elderly multigravida, unspecified trimester: Secondary | ICD-10-CM

## 2016-01-20 ENCOUNTER — Ambulatory Visit (HOSPITAL_COMMUNITY)
Admission: RE | Admit: 2016-01-20 | Discharge: 2016-01-20 | Disposition: A | Discharge status: Home / Self Care | Payer: Medicaid Other | Source: Ambulatory Visit | Attending: Physician Assistant | Admitting: Physician Assistant

## 2016-01-20 ENCOUNTER — Other Ambulatory Visit (HOSPITAL_COMMUNITY): Payer: Self-pay | Admitting: Maternal and Fetal Medicine

## 2016-01-20 ENCOUNTER — Encounter (HOSPITAL_COMMUNITY): Payer: Self-pay

## 2016-01-20 DIAGNOSIS — O09529 Supervision of elderly multigravida, unspecified trimester: Secondary | ICD-10-CM

## 2016-01-20 DIAGNOSIS — Z3A18 18 weeks gestation of pregnancy: Secondary | ICD-10-CM | POA: Insufficient documentation

## 2016-01-20 DIAGNOSIS — Z1389 Encounter for screening for other disorder: Secondary | ICD-10-CM

## 2016-01-20 DIAGNOSIS — Z36 Encounter for antenatal screening of mother: Secondary | ICD-10-CM | POA: Insufficient documentation

## 2016-01-20 DIAGNOSIS — O09522 Supervision of elderly multigravida, second trimester: Secondary | ICD-10-CM | POA: Diagnosis not present

## 2016-01-20 HISTORY — DX: Other specified health status: Z78.9

## 2016-01-23 ENCOUNTER — Other Ambulatory Visit (HOSPITAL_COMMUNITY): Payer: Self-pay | Admitting: *Deleted

## 2016-01-23 DIAGNOSIS — Z0489 Encounter for examination and observation for other specified reasons: Secondary | ICD-10-CM

## 2016-01-23 DIAGNOSIS — IMO0002 Reserved for concepts with insufficient information to code with codable children: Secondary | ICD-10-CM

## 2016-02-17 ENCOUNTER — Encounter (HOSPITAL_COMMUNITY): Payer: Self-pay

## 2016-02-17 ENCOUNTER — Ambulatory Visit (HOSPITAL_COMMUNITY)
Admission: RE | Admit: 2016-02-17 | Discharge: 2016-02-17 | Disposition: A | Discharge status: Home / Self Care | Payer: PRIVATE HEALTH INSURANCE | Source: Ambulatory Visit | Attending: Physician Assistant | Admitting: Physician Assistant

## 2016-02-17 DIAGNOSIS — Z362 Encounter for other antenatal screening follow-up: Secondary | ICD-10-CM | POA: Diagnosis present

## 2016-02-17 DIAGNOSIS — O09522 Supervision of elderly multigravida, second trimester: Secondary | ICD-10-CM | POA: Diagnosis present

## 2016-02-17 DIAGNOSIS — Z0489 Encounter for examination and observation for other specified reasons: Secondary | ICD-10-CM

## 2016-02-17 DIAGNOSIS — Z3A22 22 weeks gestation of pregnancy: Secondary | ICD-10-CM | POA: Insufficient documentation

## 2016-02-17 DIAGNOSIS — IMO0002 Reserved for concepts with insufficient information to code with codable children: Secondary | ICD-10-CM

## 2016-02-20 ENCOUNTER — Other Ambulatory Visit (HOSPITAL_COMMUNITY): Payer: Self-pay | Admitting: *Deleted

## 2016-02-20 DIAGNOSIS — O09529 Supervision of elderly multigravida, unspecified trimester: Secondary | ICD-10-CM

## 2016-04-13 ENCOUNTER — Encounter (HOSPITAL_COMMUNITY): Payer: Self-pay

## 2016-04-13 ENCOUNTER — Ambulatory Visit (HOSPITAL_COMMUNITY)
Admission: RE | Admit: 2016-04-13 | Discharge: 2016-04-13 | Disposition: A | Discharge status: Home / Self Care | Payer: Medicaid Other | Source: Ambulatory Visit | Attending: Physician Assistant | Admitting: Physician Assistant

## 2016-04-13 DIAGNOSIS — O09523 Supervision of elderly multigravida, third trimester: Secondary | ICD-10-CM | POA: Diagnosis present

## 2016-04-13 DIAGNOSIS — Z3A30 30 weeks gestation of pregnancy: Secondary | ICD-10-CM | POA: Insufficient documentation

## 2016-04-13 DIAGNOSIS — O09529 Supervision of elderly multigravida, unspecified trimester: Secondary | ICD-10-CM

## 2016-05-07 NOTE — L&D Delivery Note (Addendum)
Operative Delivery Note At 10:20 AM a viable female was delivered via Vaginal, Spontaneous Delivery.  Presentation: vertex; Position: Occiput,, Anterior;   Head delivered with ease in OA to LOT position at 06/23/2016 at 10:15 Am. With gentle downward traciton of head, anterior shoulder did not deliver. McRoberts maneuver was utilized and the anterior shoulder still did not deliver. Suprapubic Pressure was applied with no release of anterior shoulder. Assessed the posterior arm, which was extended and was unable to deliver. However, was able to easily hook fingers under posterior shoulder which could be coaxed out with gentle rocking motion. After posterior shoulder delivered, anterior shoulder was able to deliver. Cord was doubly clamped and cut and given to awaiting NICU team. APGAR: 5, ; weight  .   Placenta status: retained. No bleeding, however would not come with gentle traction on the cord and intravenous oxytocin running after 30 minutes. MD Ladean Rayaonstance called to room to perform manual removal of placenta.   Cord: 3VC, cord gas obtained.  Anesthesia: Epidural  Episiotomy: None Lacerations: None Est. Blood Loss (mL): 300   Mom to postpartum.  Baby to Nursery.  Laura Warren 06/23/2016, 11:10 AM  I was gloved and present for entire delivery SVD with shoulder dystocia as listed above, managed by me. Dr Mindi SlickerBanga and Dr Jolayne Pantheronstant arrived in room as I was getting shoulders out. Maneuvers listed above. Total time about 811min40sec.  No lacerations  Retained placenta just over 30 min.  Anterior placenta. Delivered by consult Dr Jolayne Pantheronstant.  She manually extracted it with additional instrumentation for trailing membranes.  Uterus swept with sterile gauze.   Will give dose of Zosyn afterward  Laura SignsMarie L Lovelyn Warren, CNM

## 2016-05-14 LAB — OB RESULTS CONSOLE GC/CHLAMYDIA
Chlamydia: NEGATIVE
Gonorrhea: NEGATIVE

## 2016-05-14 LAB — OB RESULTS CONSOLE GBS: GBS: POSITIVE

## 2016-06-20 ENCOUNTER — Inpatient Hospital Stay (HOSPITAL_COMMUNITY)
Admission: AD | Admit: 2016-06-20 | Discharge: 2016-06-20 | Disposition: A | Discharge status: Home / Self Care | Payer: Medicaid Other | Source: Ambulatory Visit | Attending: Obstetrics and Gynecology | Admitting: Obstetrics and Gynecology

## 2016-06-20 ENCOUNTER — Encounter (HOSPITAL_COMMUNITY): Payer: Self-pay | Admitting: *Deleted

## 2016-06-20 DIAGNOSIS — O480 Post-term pregnancy: Secondary | ICD-10-CM | POA: Diagnosis not present

## 2016-06-20 DIAGNOSIS — Z3A40 40 weeks gestation of pregnancy: Secondary | ICD-10-CM | POA: Diagnosis not present

## 2016-06-20 DIAGNOSIS — O26893 Other specified pregnancy related conditions, third trimester: Secondary | ICD-10-CM | POA: Insufficient documentation

## 2016-06-20 DIAGNOSIS — R102 Pelvic and perineal pain: Secondary | ICD-10-CM | POA: Insufficient documentation

## 2016-06-20 DIAGNOSIS — M545 Low back pain: Secondary | ICD-10-CM | POA: Insufficient documentation

## 2016-06-20 LAB — URINALYSIS, ROUTINE W REFLEX MICROSCOPIC
Bilirubin Urine: NEGATIVE
Glucose, UA: 50 mg/dL — AB
Hgb urine dipstick: NEGATIVE
Ketones, ur: NEGATIVE mg/dL
LEUKOCYTES UA: NEGATIVE
NITRITE: NEGATIVE
PH: 6 (ref 5.0–8.0)
Protein, ur: NEGATIVE mg/dL
SPECIFIC GRAVITY, URINE: 1.004 — AB (ref 1.005–1.030)

## 2016-06-20 NOTE — Discharge Instructions (Signed)

## 2016-06-20 NOTE — MAU Note (Signed)
C/o lower abdominal pressure and cramping along with lower back pain since 1900 last night; some vaginal leaking and some bloody show noted on the tisue when she wipes;

## 2016-06-21 ENCOUNTER — Telehealth (HOSPITAL_COMMUNITY): Payer: Self-pay | Admitting: *Deleted

## 2016-06-21 ENCOUNTER — Encounter (HOSPITAL_COMMUNITY): Payer: Self-pay | Admitting: *Deleted

## 2016-06-21 NOTE — Telephone Encounter (Signed)
Preadmission screen  

## 2016-06-22 ENCOUNTER — Encounter (HOSPITAL_COMMUNITY): Payer: Self-pay | Admitting: *Deleted

## 2016-06-22 ENCOUNTER — Inpatient Hospital Stay (HOSPITAL_COMMUNITY)
Admission: AD | Admit: 2016-06-22 | Discharge: 2016-06-25 | DRG: 767 | Disposition: A | Discharge status: Home / Self Care | Payer: Medicaid Other | Source: Ambulatory Visit | Attending: Obstetrics and Gynecology | Admitting: Obstetrics and Gynecology

## 2016-06-22 DIAGNOSIS — Z3A40 40 weeks gestation of pregnancy: Secondary | ICD-10-CM

## 2016-06-22 DIAGNOSIS — O99824 Streptococcus B carrier state complicating childbirth: Principal | ICD-10-CM | POA: Diagnosis present

## 2016-06-22 DIAGNOSIS — O479 False labor, unspecified: Secondary | ICD-10-CM | POA: Diagnosis present

## 2016-06-22 MED ORDER — LACTATED RINGERS IV SOLN
INTRAVENOUS | Status: DC
Start: 2016-06-23 — End: 2016-06-23
  Administered 2016-06-23: 08:00:00 via INTRAVENOUS

## 2016-06-22 NOTE — H&P (Signed)
Laura Warren is a 36 y.o. female G2P1001 @ 40.4wks by LMP and confirmed by 9wk scan presenting for eval of reg ctx. Denies leaking or bldg. [facility] and has been essentially unremarkable other than 1) AMA 2) GBS pos.  OB History    Gravida Para Term Preterm AB Living   2 1 1  0 0 1   SAB TAB Ectopic Multiple Live Births   0 0 0 0 1     Past Medical History:  Diagnosis Date  . Medical history non-contributory    Past Surgical History:  Procedure Laterality Date  . APPENDECTOMY     Family History: family history includes Cancer in her mother. Social History:  reports that she has never smoked. She has never used smokeless tobacco. She reports that she does not drink alcohol or use drugs.     Maternal Diabetes: No Genetic Screening: Normal Maternal Ultrasounds/Referrals: Normal Fetal Ultrasounds or other Referrals:  None Maternal Substance Abuse:  No Significant Maternal Medications:  None Significant Maternal Lab Results:  Lab values include: Group B Strep positive Other Comments:  None  ROS History Dilation: 5.5 Effacement (%): 80 Station: -2 Exam by:: Avery DennisonBenji Stanley RN Blood pressure 123/79, pulse 91, temperature 97.7 F (36.5 C), resp. rate 20, height 5\' 5"  (1.651 m), weight 82.4 kg (181 lb 9.6 oz), last menstrual period 09/12/2015, unknown if currently breastfeeding. Exam Physical Exam  Constitutional: She is oriented to person, place, and time. She appears well-developed.  HENT:  Head: Normocephalic.  Neck: Normal range of motion.  Cardiovascular: Normal rate.   Respiratory: Effort normal.  GI:  EFM 130s, +accels, no decels Ctx q 3-5 mins  Musculoskeletal: Normal range of motion.  Neurological: She is alert and oriented to person, place, and time.  Skin: Skin is warm and dry.  Psychiatric: She has a normal mood and affect. Her behavior is normal. Thought content normal.    Prenatal labs: ABO, Rh: O/Positive/-- (07/31  0000) Antibody: Negative (07/31 0000) Rubella: Immune (07/31 0000) RPR: Nonreactive (07/31 0000)  HBsAg: Negative (07/31 0000)  HIV: Non-reactive (07/31 0000)  GBS: Positive (01/08 0000)   Assessment/Plan: IUP@40 .4wks Early labor GBS pos  [facility] PCN for GBS ppx Anticipate SVD   SHAW, KIMBERLY CNM 06/22/2016, 11:41 PM

## 2016-06-22 NOTE — MAU Note (Signed)
URINE IN LAB 

## 2016-06-22 NOTE — MAU Note (Signed)
Contractions since 1600. Some blood on tissue when urinates. Bloody mucous. 1cm last sve

## 2016-06-23 ENCOUNTER — Encounter (HOSPITAL_COMMUNITY): Payer: Self-pay

## 2016-06-23 ENCOUNTER — Inpatient Hospital Stay (HOSPITAL_COMMUNITY): Payer: Medicaid Other | Admitting: Anesthesiology

## 2016-06-23 DIAGNOSIS — Z3A40 40 weeks gestation of pregnancy: Secondary | ICD-10-CM | POA: Diagnosis not present

## 2016-06-23 DIAGNOSIS — O99824 Streptococcus B carrier state complicating childbirth: Secondary | ICD-10-CM | POA: Diagnosis present

## 2016-06-23 DIAGNOSIS — Z3493 Encounter for supervision of normal pregnancy, unspecified, third trimester: Secondary | ICD-10-CM | POA: Diagnosis present

## 2016-06-23 DIAGNOSIS — O479 False labor, unspecified: Secondary | ICD-10-CM | POA: Diagnosis present

## 2016-06-23 LAB — CBC
HCT: 38.5 % (ref 36.0–46.0)
Hemoglobin: 13.3 g/dL (ref 12.0–15.0)
MCH: 29.3 pg (ref 26.0–34.0)
MCHC: 34.5 g/dL (ref 30.0–36.0)
MCV: 84.8 fL (ref 78.0–100.0)
PLATELETS: 151 10*3/uL (ref 150–400)
RBC: 4.54 MIL/uL (ref 3.87–5.11)
RDW: 14.1 % (ref 11.5–15.5)
WBC: 10.3 10*3/uL (ref 4.0–10.5)

## 2016-06-23 LAB — RPR: RPR Ser Ql: NONREACTIVE

## 2016-06-23 LAB — TYPE AND SCREEN
ABO/RH(D): O POS
ANTIBODY SCREEN: NEGATIVE

## 2016-06-23 MED ORDER — ONDANSETRON HCL 4 MG PO TABS: 4.0000 mg | ORAL_TABLET | ORAL | Status: DC | PRN

## 2016-06-23 MED ORDER — FENTANYL 2.5 MCG/ML BUPIVACAINE 1/10 % EPIDURAL INFUSION (WH - ANES): 14.0000 mL/h | INTRAMUSCULAR | Status: DC | PRN

## 2016-06-23 MED ORDER — PHENYLEPHRINE 40 MCG/ML (10ML) SYRINGE FOR IV PUSH (FOR BLOOD PRESSURE SUPPORT)
80.0000 ug | PREFILLED_SYRINGE | INTRAVENOUS | Status: DC | PRN
  Filled 2016-06-23: qty 5

## 2016-06-23 MED ORDER — LACTATED RINGERS IV SOLN: 500.0000 mL | INTRAVENOUS | Status: DC | PRN

## 2016-06-23 MED ORDER — DIPHENHYDRAMINE HCL 25 MG PO CAPS: 25.0000 mg | ORAL_CAPSULE | Freq: Four times a day (QID) | ORAL | Status: DC | PRN

## 2016-06-23 MED ORDER — DIPHENHYDRAMINE HCL 50 MG/ML IJ SOLN
12.5000 mg | INTRAMUSCULAR | Status: DC | PRN
  Administered 2016-06-23: 12.5 mg via INTRAVENOUS
  Filled 2016-06-23: qty 1

## 2016-06-23 MED ORDER — OXYCODONE-ACETAMINOPHEN 5-325 MG PO TABS: 2.0000 | ORAL_TABLET | ORAL | Status: DC | PRN

## 2016-06-23 MED ORDER — PENICILLIN G POT IN DEXTROSE 60000 UNIT/ML IV SOLN
3.0000 10*6.[IU] | INTRAVENOUS | Status: DC
  Administered 2016-06-23 (×2): 3 10*6.[IU] via INTRAVENOUS
  Filled 2016-06-23 (×6): qty 50

## 2016-06-23 MED ORDER — PIPERACILLIN-TAZOBACTAM 3.375 G IVPB 30 MIN
3.3750 g | Freq: Once | INTRAVENOUS | Status: AC
  Administered 2016-06-23: 3.375 g via INTRAVENOUS
  Filled 2016-06-23: qty 50

## 2016-06-23 MED ORDER — PRENATAL MULTIVITAMIN CH
1.0000 | ORAL_TABLET | Freq: Every day | ORAL | Status: DC
  Administered 2016-06-24: 1 via ORAL

## 2016-06-23 MED ORDER — PHENYLEPHRINE 40 MCG/ML (10ML) SYRINGE FOR IV PUSH (FOR BLOOD PRESSURE SUPPORT)
80.0000 ug | PREFILLED_SYRINGE | INTRAVENOUS | Status: DC | PRN
  Filled 2016-06-23: qty 5
  Filled 2016-06-23: qty 10

## 2016-06-23 MED ORDER — SIMETHICONE 80 MG PO CHEW: 80.0000 mg | CHEWABLE_TABLET | ORAL | Status: DC | PRN

## 2016-06-23 MED ORDER — OXYTOCIN BOLUS FROM INFUSION
500.0000 mL | Freq: Once | INTRAVENOUS | Status: AC
  Administered 2016-06-23: 500 mL via INTRAVENOUS

## 2016-06-23 MED ORDER — WITCH HAZEL-GLYCERIN EX PADS: 1.0000 "application " | MEDICATED_PAD | CUTANEOUS | Status: DC | PRN

## 2016-06-23 MED ORDER — FENTANYL 2.5 MCG/ML BUPIVACAINE 1/10 % EPIDURAL INFUSION (WH - ANES)
14.0000 mL/h | INTRAMUSCULAR | Status: DC | PRN
  Administered 2016-06-23 (×2): 14 mL/h via EPIDURAL
  Filled 2016-06-23 (×2): qty 100

## 2016-06-23 MED ORDER — ACETAMINOPHEN 325 MG PO TABS: 650.0000 mg | ORAL_TABLET | ORAL | Status: DC | PRN

## 2016-06-23 MED ORDER — ONDANSETRON HCL 4 MG/2ML IJ SOLN: 4.0000 mg | Freq: Four times a day (QID) | INTRAMUSCULAR | Status: DC | PRN

## 2016-06-23 MED ORDER — PENICILLIN G POTASSIUM 5000000 UNITS IJ SOLR
5.0000 10*6.[IU] | Freq: Once | INTRAVENOUS | Status: AC
  Administered 2016-06-23: 5 10*6.[IU] via INTRAVENOUS
  Filled 2016-06-23: qty 5

## 2016-06-23 MED ORDER — EPHEDRINE 5 MG/ML INJ
10.0000 mg | INTRAVENOUS | Status: DC | PRN
  Filled 2016-06-23: qty 4

## 2016-06-23 MED ORDER — FENTANYL CITRATE (PF) 100 MCG/2ML IJ SOLN: 100.0000 ug | INTRAMUSCULAR | Status: DC | PRN

## 2016-06-23 MED ORDER — SOD CITRATE-CITRIC ACID 500-334 MG/5ML PO SOLN: 30.0000 mL | ORAL | Status: DC | PRN

## 2016-06-23 MED ORDER — BENZOCAINE-MENTHOL 20-0.5 % EX AERO: 1.0000 "application " | INHALATION_SPRAY | CUTANEOUS | Status: DC | PRN

## 2016-06-23 MED ORDER — IBUPROFEN 600 MG PO TABS
600.0000 mg | ORAL_TABLET | Freq: Four times a day (QID) | ORAL | Status: DC
  Administered 2016-06-23 – 2016-06-25 (×9): 600 mg via ORAL
  Filled 2016-06-23 (×7): qty 1

## 2016-06-23 MED ORDER — ONDANSETRON HCL 4 MG/2ML IJ SOLN: 4.0000 mg | INTRAMUSCULAR | Status: DC | PRN

## 2016-06-23 MED ORDER — ACETAMINOPHEN 325 MG PO TABS
650.0000 mg | ORAL_TABLET | ORAL | Status: DC | PRN
Start: 2016-06-23 — End: 2016-06-25
  Administered 2016-06-23 – 2016-06-25 (×4): 650 mg via ORAL
  Filled 2016-06-23 (×2): qty 2

## 2016-06-23 MED ORDER — LACTATED RINGERS IV SOLN
500.0000 mL | Freq: Once | INTRAVENOUS | Status: AC
  Administered 2016-06-23: 500 mL via INTRAVENOUS

## 2016-06-23 MED ORDER — SENNOSIDES-DOCUSATE SODIUM 8.6-50 MG PO TABS
2.0000 | ORAL_TABLET | ORAL | Status: DC
  Administered 2016-06-23 – 2016-06-24 (×2): 2 via ORAL
  Filled 2016-06-23 (×2): qty 2

## 2016-06-23 MED ORDER — LIDOCAINE HCL (PF) 1 % IJ SOLN
INTRAMUSCULAR | Status: DC | PRN
  Administered 2016-06-23 (×2): 4 mL

## 2016-06-23 MED ORDER — OXYCODONE-ACETAMINOPHEN 5-325 MG PO TABS: 1.0000 | ORAL_TABLET | ORAL | Status: DC | PRN

## 2016-06-23 MED ORDER — ZOLPIDEM TARTRATE 5 MG PO TABS
5.0000 mg | ORAL_TABLET | Freq: Every evening | ORAL | Status: DC | PRN
Start: 2016-06-23 — End: 2016-06-25

## 2016-06-23 MED ORDER — COCONUT OIL OIL: 1.0000 "application " | TOPICAL_OIL | Status: DC | PRN

## 2016-06-23 MED ORDER — TETANUS-DIPHTH-ACELL PERTUSSIS 5-2.5-18.5 LF-MCG/0.5 IM SUSP: 0.5000 mL | Freq: Once | INTRAMUSCULAR | Status: DC

## 2016-06-23 MED ORDER — DIBUCAINE 1 % RE OINT: 1.0000 "application " | TOPICAL_OINTMENT | RECTAL | Status: DC | PRN

## 2016-06-23 MED ORDER — LIDOCAINE HCL (PF) 1 % IJ SOLN
30.0000 mL | INTRAMUSCULAR | Status: DC | PRN
  Filled 2016-06-23: qty 30

## 2016-06-23 MED ORDER — OXYTOCIN 40 UNITS IN LACTATED RINGERS INFUSION - SIMPLE MED
2.5000 [IU]/h | INTRAVENOUS | Status: DC
  Filled 2016-06-23: qty 1000

## 2016-06-23 NOTE — Anesthesia Procedure Notes (Signed)
Epidural Patient location during procedure: OB  Staffing Anesthesiologist: Kay Shippy Performed: anesthesiologist   Preanesthetic Checklist Completed: patient identified, pre-op evaluation, timeout performed, IV checked, risks and benefits discussed and monitors and equipment checked  Epidural Patient position: sitting Prep: site prepped and draped and DuraPrep Patient monitoring: heart rate, continuous pulse ox and blood pressure Approach: midline Location: L3-L4 Injection technique: LOR air and LOR saline  Needle:  Needle type: Tuohy  Needle gauge: 17 G Needle length: 9 cm Needle insertion depth: 5 cm Catheter type: closed end flexible Catheter size: 19 Gauge Catheter at skin depth: 11 cm Test dose: negative  Assessment Sensory level: T8 Events: blood not aspirated, injection not painful, no injection resistance, negative IV test and no paresthesia  Additional Notes Reason for block:procedure for pain     

## 2016-06-23 NOTE — Anesthesia Preprocedure Evaluation (Signed)
Anesthesia Evaluation  Patient identified by MRN, date of birth, ID band Patient awake    Reviewed: Allergy & Precautions, NPO status , Patient's Chart, lab work & pertinent test results  Airway Mallampati: II  TM Distance: >3 FB Neck ROM: Full    Dental no notable dental hx.    Pulmonary neg pulmonary ROS,    Pulmonary exam normal breath sounds clear to auscultation       Cardiovascular negative cardio ROS Normal cardiovascular exam Rhythm:Regular Rate:Normal     Neuro/Psych negative neurological ROS  negative psych ROS   GI/Hepatic negative GI ROS, Neg liver ROS,   Endo/Other  negative endocrine ROS  Renal/GU negative Renal ROS     Musculoskeletal negative musculoskeletal ROS (+)   Abdominal   Peds  Hematology negative hematology ROS (+)   Anesthesia Other Findings   Reproductive/Obstetrics (+) Pregnancy                             Anesthesia Physical Anesthesia Plan  ASA: II  Anesthesia Plan: Epidural   Post-op Pain Management:    Induction:   Airway Management Planned:   Additional Equipment:   Intra-op Plan:   Post-operative Plan:   Informed Consent: I have reviewed the patients History and Physical, chart, labs and discussed the procedure including the risks, benefits and alternatives for the proposed anesthesia with the patient or authorized representative who has indicated his/her understanding and acceptance.     Plan Discussed with:   Anesthesia Plan Comments:         Anesthesia Quick Evaluation  

## 2016-06-23 NOTE — Lactation Note (Signed)
This note was copied from a baby's chart. Lactation Consultation Note Initial visit at 6 hours of age. Mom reports she doesn't have enough milk for baby and thinks baby is hungry.  Baby is asleep in crib, LC discussed feeding cues and normal newborn behavior.  Mom is able to hand express with drops easily expressed bilaterally.  Mom reports formula feeding with breast feeding older child for 11 months.  LC encouraged mom to feed this baby on demand at the breast to establish a good supply of milk.  LC discussed spoon feeding if baby is not latching well or mom is otherwise concerned.   [facility] resources given and discussed.  Encouraged to feed with early cues on demand. LC encouraged mom to fill our feeding log to report to staff.  LC discussed baby should eat 8-12X/24 hours and maybe a little sleepy today.  Mom to call for assist as needed.    Patient Name: Boy Alvira PhilipsSubhadra Nygren WUJWJ'XToday's Date: 06/23/2016 Reason for consult: Initial assessment   Maternal Data Has patient been taught Hand Expression?: Yes Does the patient have breastfeeding experience prior to this delivery?: Yes  Feeding Feeding Type: Breast Fed Length of feed: 10 min  LATCH Score/Interventions Latch: Repeated attempts needed to sustain latch, nipple held in mouth throughout feeding, stimulation needed to elicit sucking reflex. Intervention(s): Skin to skin;Teach feeding cues;Waking techniques Intervention(s): Adjust position;Assist with latch;Breast massage;Breast compression  Audible Swallowing: A few with stimulation Intervention(s): Skin to skin;Hand expression Intervention(s): Alternate breast massage  Type of Nipple: Everted at rest and after stimulation Intervention(s): Reverse pressure  Comfort (Breast/Nipple): Soft / non-tender     Hold (Positioning): Assistance needed to correctly position infant at breast and maintain latch. Intervention(s): Breastfeeding basics reviewed;Skin to skin  LATCH Score:  7  Lactation Tools Discussed/Used WIC Program: Yes   Consult Status Consult Status: Follow-up Date: 06/24/16 Follow-up type: In-patient    Aailyah Dunbar, Arvella MerlesJana Lynn 06/23/2016, 4:36 PM

## 2016-06-23 NOTE — Anesthesia Pain Management Evaluation Note (Signed)
  CRNA Pain Management Visit Note  Patient: Laura PhilipsSubhadra Warren, 36 y.o., female  "Hello [facility] am a [facility]. We have an anesthesia team available at all times to provide care throughout the hospital, including epidural management and anesthesia for C-section. I don't know your plan for the delivery whether it a natural birth, water birth, IV sedation, nitrous supplementation, doula or epidural, but we want to meet your pain goals."   1.Was your pain managed to your expectations on prior hospitalizations?   Yes   2.What is your expectation for pain management during this hospitalization?     Epidural  3.How can we help you reach that goal? Epidural in place and working well.  Record the patient's initial score and the patient's pain goal.   Pain: 1  Pain Goal: 5 [facility] wants you to be able to say your pain was always managed very well.  Karron Goens 06/23/2016

## 2016-06-23 NOTE — Progress Notes (Signed)
Patient ID: Alvira PhilipsSubhadra Maiello, female   DOB: 08/06/1980, 36 y.o.   MRN: [id number]  Comfortable w/ epidural  VSS, afeb FHR 120-130s, +accels, no decels Ctx q 3-5 mins Cx 8/C/0; AROM for clear fluid  IUP@term  Active labor/transition  Check cx in 2 hrs or sooner prn Anticipate SVD  Cam HaiSHAW, Lorean Ekstrand  CNM 06/23/2016 6:36 AM

## 2016-06-24 NOTE — Progress Notes (Signed)
Post Partum Day 2 Subjective: no complaints, up ad lib, voiding and tolerating PO  Objective: Blood pressure 100/71, pulse 79, temperature 97.7 F (36.5 C), temperature source Oral, resp. rate 18, height 5\' 5"  (1.651 m), weight 82.1 kg (181 lb), last menstrual period 09/12/2015, SpO2 98 %, unknown if currently breastfeeding.  Physical Exam:  General: alert Lochia: appropriate Uterine Fundus: firm and NT at U-2 Incision: n/a DVT Evaluation: No evidence of DVT seen on physical exam.   Recent Labs  06/22/16 2347  HGB 13.3  HCT 38.5    Assessment/Plan: Plan for discharge tomorrow   LOS: 1 day   Allie BossierMyra C Venora Kautzman 06/24/2016, 7:44 AM

## 2016-06-24 NOTE — Anesthesia Postprocedure Evaluation (Signed)
Anesthesia Post Note  Patient: Laura PhilipsSubhadra Weller  Procedure(s) Performed: * No procedures listed *  Patient location during evaluation: Mother Baby Anesthesia Type: Epidural Level of consciousness: awake Pain management: satisfactory to patient Vital Signs Assessment: post-procedure vital signs reviewed and stable Respiratory status: spontaneous breathing Cardiovascular status: stable Anesthetic complications: no        Last Vitals:  Vitals:   06/23/16 1930 06/24/16 0541  BP: 110/68 100/71  Pulse: 84 79  Resp: 18 18  Temp: 36.6 C 36.5 C    Last Pain:  Vitals:   06/24/16 0553  TempSrc:   PainSc: 7    Pain Goal: Patients Stated Pain Goal: 0 (06/22/16 2205)               Cephus ShellingBURGER,Dnya Hickle

## 2016-06-25 DIAGNOSIS — Z3A40 40 weeks gestation of pregnancy: Secondary | ICD-10-CM

## 2016-06-25 MED ORDER — IBUPROFEN 600 MG PO TABS: 600.0000 mg | ORAL_TABLET | Freq: Four times a day (QID) | ORAL | 0 refills | Status: DC

## 2016-06-25 NOTE — Discharge Instructions (Signed)

## 2016-06-25 NOTE — Lactation Note (Signed)
This note was copied from a baby's chart. Lactation Consultation Note: Mother has infant latched on in cradle position. She reports 5 pain scale with latch. Observed that mothers nipple is flat on one side. Advised mother to place infant in football hold.  Infant sustained latch for 10 mins. Assist mother with tugging on infant chin for wider gage and flanging upper lip.  Observed that mothers nipple was round when infant released the breast. Advised mother to rotate positions frequently . Suggested to cue base feed infant and feed infant 8-12 times in 24 hours. Discussed doing good breast massage and ice for breast to soften as needed. Mother has hand pump to use as needed.Mother receptive to all teaching. Mother is aware of all LC services and community support.  Patient Name: Laura Alvira PhilipsSubhadra Warren OACZY'SToday's Date: 06/25/2016 Reason for consult: Follow-up assessment   Maternal Data    Feeding Feeding Type: Breast Fed Length of feed: 25 min  LATCH Score/Interventions Latch: Repeated attempts needed to sustain latch, nipple held in mouth throughout feeding, stimulation needed to elicit sucking reflex. Intervention(s): Adjust position  Audible Swallowing: Spontaneous and intermittent Intervention(s):  (changed postion from cradle to cross cradle.)  Type of Nipple: Everted at rest and after stimulation  Comfort (Breast/Nipple): Filling, red/small blisters or bruises, mild/mod discomfort  Problem noted: Mild/Moderate discomfort Interventions (Filling): Hand pump Interventions (Mild/moderate discomfort): Hand expression  Hold (Positioning): Assistance needed to correctly position infant at breast and maintain latch. Intervention(s): Support Pillows;Position options  LATCH Score: 7  Lactation Tools Discussed/Used     Consult Status      Laura BickersKendrick, Laura Warren 06/25/2016, 9:53 AM

## 2016-06-25 NOTE — Discharge Summary (Signed)
Obstetric Discharge Summary Reason for Admission: onset of labor Prenatal Procedures: none Intrapartum Procedures: spontaneous vaginal delivery Postpartum Procedures: none Complications-Operative and Postpartum: none Hemoglobin  Date Value Ref Range Status  06/22/2016 13.3 12.0 - 15.0 g/dL Final   HCT  Date Value Ref Range Status  06/22/2016 38.5 36.0 - 46.0 % Final    Physical Exam:  General: alert Lochia: appropriate Uterine Fundus: firm and NT at U-2, rectus diastesis noted DVT Evaluation: No evidence of DVT seen on physical exam.  Discharge Diagnoses: Term Pregnancy-delivered  Discharge Information: Date: 06/25/2016 Activity: pelvic rest Diet: routine Medications: PNV and Ibuprofen Condition: stable Instructions: refer to practice specific booklet Discharge to: home  [facility] Follow-up Information    [facility] Schedule an appointment as soon as possible for a visit in 6 week(s).   Contact information: [address] [place] [address] [phone]           Newborn Data: Live born female  Birth Weight: 8 lb 5.9 oz (3795 g) APGAR: 5, 7  Home with mother.  Allie BossierMyra C Ximenna Fonseca 06/25/2016, 7:31 AM

## 2016-06-25 NOTE — Progress Notes (Signed)
CSW acknowledged consult and met with MOB and FOB and MOB's bedside.  When CSW arrived, MOB was engaging in skin to skin with infant.  MOB gave CSW permission to meet with MOB while FOB was present.  CSW inquired about MOB's MH hx and MOB denied a hx.  CSW asked specifics questions pertaining to maternal depression and MOB denied all signs and symptoms. MOB appeared unsure of why CSW was asking questions about MOB's MH as evidence by MOB's facial expression and MOB curiosity questions.  MOB reported that MOB was [facility] and [facility] declined.  MOB stated that MOB has felt happy about pregnancy and again denied all signs and symptoms of maternal depression.  CSW educated MOB about PPD. CSW informed MOB of possible supports and interventions to decrease PPD.  CSW also encouraged MOB to seek medical attention if needed for increased signs and symptoms for PPD. MOB denied other psychosocial stressors.  CSW thanked MOB for meeting with CSW and provided MOB with CSW contact information.  There are no barriers to d/c.  Laurey Arrow, MSW, LCSW Clinical Social Work [phone]

## 2016-06-25 NOTE — Progress Notes (Signed)
Post discharge chart review completed.  

## 2016-06-27 ENCOUNTER — Inpatient Hospital Stay (HOSPITAL_COMMUNITY): Admission: RE | Admit: 2016-06-27 | Payer: Medicaid Other | Source: Ambulatory Visit

## 2016-09-19 ENCOUNTER — Encounter: Payer: Self-pay | Admitting: Family Medicine

## 2017-01-24 ENCOUNTER — Ambulatory Visit: Payer: Medicaid Other | Attending: Internal Medicine | Admitting: Physician Assistant

## 2017-01-24 ENCOUNTER — Encounter: Payer: Self-pay | Admitting: Physician Assistant

## 2017-01-24 VITALS — BP 96/61 | HR 78 | Temp 97.9°F | Resp 18 | Ht 65.0 in | Wt 155.0 lb

## 2017-01-24 DIAGNOSIS — H6123 Impacted cerumen, bilateral: Secondary | ICD-10-CM | POA: Diagnosis not present

## 2017-01-24 DIAGNOSIS — R519 Headache, unspecified: Secondary | ICD-10-CM

## 2017-01-24 DIAGNOSIS — R112 Nausea with vomiting, unspecified: Secondary | ICD-10-CM | POA: Diagnosis not present

## 2017-01-24 DIAGNOSIS — R5383 Other fatigue: Secondary | ICD-10-CM | POA: Diagnosis not present

## 2017-01-24 DIAGNOSIS — R51 Headache: Secondary | ICD-10-CM | POA: Diagnosis not present

## 2017-01-24 LAB — POCT URINE PREGNANCY: PREG TEST UR: NEGATIVE

## 2017-01-24 MED ORDER — CARBAMIDE PEROXIDE 6.5 % OT SOLN: OTIC | 0 refills | Status: DC

## 2017-01-24 MED ORDER — FLUTICASONE PROPIONATE 50 MCG/ACT NA SUSP: 2.0000 | Freq: Every day | NASAL | 6 refills | Status: DC

## 2017-01-24 NOTE — Patient Instructions (Signed)
Use nasal spray daily Use ear drops for 2 nights prior to next visit.

## 2017-01-24 NOTE — Progress Notes (Signed)
Patient ID: Laura Warren, female   DOB: 1980/07/19, 36 y.o.   MRN: [id number]    Laura Warren, is a 36 y.o. female  [id number]  [id number]  DOB - 1981/03/18  Subjective:  Chief Complaint and HPI: Laura Warren is a 36 y.o. female here today for HA X 2 weeks.  No neurological changes.  No vision changes except it is time for her to get new glasses and she has already made the appt for that.  She had HA similar to this several years ago.  HA relieved with ibuprofen but she has been using it only sparingly bc she is still nursing.  She does have some nasal congestion.  3 days ago she vomited X 1.  No nausea now.  +some intermittent dizziness.  Not using BC.  No f/c.  Occasional sneezing.  HA is temporal and face "feels swollen." LMP: 01/09/2017 75 month old baby breast feeding(pumped breast milk ~3times daily supplementing meals)  No FH aneurysms/sudden death.   Social:  Married and nursing 60 month old.  No BC.    ROS:   Constitutional:  No f/c, No night sweats, No unexplained weight loss. EENT:  No vision changes, No blurry vision, No hearing changes. No mouth, throat, or ear problems. + nasal congestion Respiratory: No cough, No SOB Cardiac: No CP, no palpitations GI:  No abd pain, 1 episode nausea with vomiting 3 days ago. GU: No Urinary s/sx Musculoskeletal: No joint pain Neuro: + headache, + dizziness, no motor weakness.  Skin: No rash Endocrine:  No polydipsia. No polyuria.  Psych: Denies SI/HI  No problems updated.  ALLERGIES: No Known Allergies  PAST MEDICAL HISTORY: Past Medical History:  Diagnosis Date  . Medical history non-contributory     MEDICATIONS AT HOME: Prior to Admission medications   Medication Sig Start Date End Date Taking? Authorizing Provider  carbamide peroxide (DEBROX) 6.5 % OTIC solution Place 5 drops in each ear for 2 nights before your next visit 01/24/17   Anders Simmonds, PA-C  fluticasone [facility]) 50 MCG/ACT nasal spray Place 2  sprays into both nostrils daily. 01/24/17   Anders Simmonds, PA-C  ibuprofen (ADVIL,MOTRIN) 600 MG tablet Take 1 tablet (600 mg total) by mouth every 6 (six) hours. 06/25/16   Allie Bossier, MD  Prenatal Vit-Fe Fumarate-FA (PRENATAL MULTIVITAMIN) TABS tablet Take 1 tablet by mouth daily at 12 noon.    [provider]  promethazine (PHENERGAN) 12.5 MG tablet Take 1 tablet (12.5 mg total) by mouth every 6 (six) hours as needed for nausea or vomiting. Patient not taking: Reported on 04/13/2016 11/14/15   Judeth Horn, NP     Objective:  EXAM:   Vitals:   01/24/17 1456  BP: 96/61  Pulse: 78  Resp: 18  Temp: 97.9 F (36.6 C)  TempSrc: Oral  SpO2: 97%  Weight: 155 lb (70.3 kg)  Height:  (1.651 m)    General appearance : A&OX3. NAD. Non-toxic-appearing HEENT: Atraumatic and Normocephalic.  PERRLA. EOM intact.  TM blocked by cerumen B. Mouth-MMM, post pharynx WNL w/o erythema, +clear PND.  Turbinates enlarged, boggy, and pale.   Neck: supple, no JVD. No cervical lymphadenopathy. No thyromegaly Chest/Lungs:  Breathing-non-labored, Good air entry bilaterally, breath sounds normal without rales, rhonchi, or wheezing  CVS: S1 S2 regular, no murmurs, gallops, rubs  Extremities: Bilateral Lower Ext shows no edema, both legs are warm to touch with = pulse throughout Neurology:  CN II-XII grossly intact, Non focal.  Finger to  nose, heel to shin Psych:  TP linear. J/I WNL. Normal speech. Appropriate eye contact and affect.  Skin:  No Rash  Data Review No results found for: HGBA1C   Assessment & Plan   1. Nausea and vomiting, intractability of vomiting not specified, unspecified vomiting type - POCT urine pregnancy-negative - Comprehensive metabolic panel  2. Bilateral impacted cerumen - carbamide peroxide (DEBROX) 6.5 % OTIC solution; Place 5 drops in each ear for 2 nights before your next visit  Dispense: 15 mL; Refill: 0 -irrigate at f/up OV  3. Nonintractable headache,  unspecified chronicity pattern, unspecified headache type Use ibuprofen prn.  No red flags today.  Likely sinus component with current fall weeds blooming and obvious congestion on exam - fluticasone (FLONASE) 50 MCG/ACT nasal spray; Place 2 sprays into both nostrils daily.  Dispense: 16 g; Refill: 6 - TSH - Vitamin D, 25-hydroxy - Comprehensive metabolic panel  4. Fatigue, unspecified type - TSH - CBC with Differential/Platelet  Patient have been counseled extensively about nutrition and exercise  Return in about 2 weeks (around 02/07/2017) for f/up 1-2 weeks with me to recheck HA and ear irrigation.  The patient was given clear instructions to go to ER or return to medical center if symptoms don't improve, worsen or new problems develop. The patient verbalized understanding. The patient was told to call to get lab results if they haven't heard anything in the next week.     Georgian Co, PA-C [facility] [place], [place] [phone]   01/24/2017, 3:13 PM

## 2017-01-24 NOTE — Progress Notes (Signed)
JAPatient is here for headaches that comes & goes mornings are worst vomit 3 days ago & now just feel nauseas

## 2017-01-25 LAB — COMPREHENSIVE METABOLIC PANEL
A/G RATIO: 1.7 (ref 1.2–2.2)
ALT: 17 IU/L (ref 0–32)
AST: 18 IU/L (ref 0–40)
Albumin: 4 g/dL (ref 3.5–5.5)
Alkaline Phosphatase: 70 IU/L (ref 39–117)
BUN/Creatinine Ratio: 14 (ref 9–23)
BUN: 9 mg/dL (ref 6–20)
Bilirubin Total: 0.3 mg/dL (ref 0.0–1.2)
CALCIUM: 9 mg/dL (ref 8.7–10.2)
CO2: 26 mmol/L (ref 20–29)
Chloride: 99 mmol/L (ref 96–106)
Creatinine, Ser: 0.65 mg/dL (ref 0.57–1.00)
GFR, EST AFRICAN AMERICAN: 132 mL/min/{1.73_m2} (ref >59)
GFR, EST NON AFRICAN AMERICAN: 115 mL/min/{1.73_m2} (ref >59)
GLOBULIN, TOTAL: 2.4 g/dL (ref 1.5–4.5)
Glucose: 126 mg/dL — ABNORMAL HIGH (ref 65–99)
Potassium: 3.9 mmol/L (ref 3.5–5.2)
SODIUM: 137 mmol/L (ref 134–144)
Total Protein: 6.4 g/dL (ref 6.0–8.5)

## 2017-01-25 LAB — CBC WITH DIFFERENTIAL/PLATELET
BASOS: 1 %
Basophils Absolute: 0 10*3/uL (ref 0.0–0.2)
EOS (ABSOLUTE): 0.1 10*3/uL (ref 0.0–0.4)
EOS: 2 %
HEMATOCRIT: 40.9 % (ref 34.0–46.6)
Hemoglobin: 13.3 g/dL (ref 11.1–15.9)
IMMATURE GRANULOCYTES: 0 %
Immature Grans (Abs): 0 10*3/uL (ref 0.0–0.1)
LYMPHS ABS: 2 10*3/uL (ref 0.7–3.1)
Lymphs: 33 %
MCH: 27.5 pg (ref 26.6–33.0)
MCHC: 32.5 g/dL (ref 31.5–35.7)
MCV: 85 fL (ref 79–97)
MONOS ABS: 0.4 10*3/uL (ref 0.1–0.9)
Monocytes: 7 %
NEUTROS ABS: 3.5 10*3/uL (ref 1.4–7.0)
NEUTROS PCT: 57 %
Platelets: 210 10*3/uL (ref 150–379)
RBC: 4.83 x10E6/uL (ref 3.77–5.28)
RDW: 12.9 % (ref 12.3–15.4)
WBC: 6.1 10*3/uL (ref 3.4–10.8)

## 2017-01-25 LAB — TSH: TSH: <0.006 u[IU]/mL — ABNORMAL LOW (ref 0.450–4.500)

## 2017-01-25 LAB — VITAMIN D 25 HYDROXY (VIT D DEFICIENCY, FRACTURES): Vit D, 25-Hydroxy: 22.4 ng/mL — ABNORMAL LOW (ref 30.0–100.0)

## 2017-01-25 NOTE — Addendum Note (Signed)
Addended by: Roger Kill on: 01/25/2017 01:41 PM   Modules accepted: Orders

## 2017-01-27 LAB — T3, FREE: T3, Free: 4.7 pg/mL — ABNORMAL HIGH (ref 2.0–4.4)

## 2017-01-27 LAB — T4: T4, Total: 10.9 ug/dL (ref 4.5–12.0)

## 2017-01-27 LAB — SPECIMEN STATUS REPORT

## 2017-01-29 ENCOUNTER — Other Ambulatory Visit: Payer: Self-pay | Admitting: Physician Assistant

## 2017-01-29 DIAGNOSIS — E559 Vitamin D deficiency, unspecified: Secondary | ICD-10-CM

## 2017-01-29 MED ORDER — VITAMIN D (ERGOCALCIFEROL) 1.25 MG (50000 UNIT) PO CAPS: 50000.0000 [IU] | ORAL_CAPSULE | ORAL | 0 refills | Status: DC

## 2017-01-31 ENCOUNTER — Telehealth: Payer: Self-pay | Admitting: *Deleted

## 2017-01-31 NOTE — Telephone Encounter (Signed)
-----   Message from Anders Simmonds, [place] sent at 01/29/2017  8:58 AM EDT ----- Please call patient and let them that their vitamin D is low.  This can contribute to muscle aches, anxiety, fatigue, and depression.  I have sent a prescription to the pharmacy for them to take once a week.  We will recheck this level in 3-4 months.  Her other labs are norma.  Follow-up as planned. Thanks, Georgian Co, PA-C

## 2017-01-31 NOTE — Telephone Encounter (Signed)
Medical Assistant left message on patient's home and cell voicemail. Voicemail states to give a call back to [place] with [facility] at [phone]. !!!Please inform patient of vitamin d level being low and this can contribute to muscle and bone aches, anxiety and depression. Prescription has been sent to the [facility] to take once a week and a recheck will be completed in 3-4 months. All other labs are normal.

## 2017-02-07 ENCOUNTER — Ambulatory Visit: Payer: Medicaid Other | Attending: Internal Medicine | Admitting: Physician Assistant

## 2017-02-07 VITALS — BP 94/60 | HR 77 | Temp 98.1°F | Resp 18 | Ht 65.0 in | Wt 153.0 lb

## 2017-02-07 DIAGNOSIS — H6123 Impacted cerumen, bilateral: Secondary | ICD-10-CM | POA: Insufficient documentation

## 2017-02-07 NOTE — Progress Notes (Signed)
Patient ID: Laura Warren, female   DOB: 1980-10-20, 36 y.o.   MRN: [id number]   Laura Warren, is a 36 y.o. female  [id number]  [id number]  DOB - [birth year]  Subjective:  Chief Complaint and HPI: [name] is a 36 y.o. female here today for B ear irrigation.  I had seen her recently and she used debrox for the past 2 nights as recommended and is here today for irrigation.  She denies otalgia or dizziness.  No new problems today.  Her 82 month old's pediatrician said it is ok for her to take the high dose Vitamin D recently prescribed while she is still nursing.    ROS:   Constitutional:  No f/c, No night sweats, No unexplained weight loss. EENT:  No vision changes, No blurry vision, No hearing changes. No mouth, throat, or ear problems.  Respiratory: No cough, No SOB Cardiac: No CP, no palpitations GI:  No abd pain, No N/V/D. GU: No Urinary s/sx Musculoskeletal: No joint pain Neuro: +occasional/not new headache, no dizziness, no motor weakness.  Skin: No rash Endocrine:  No polydipsia. No polyuria.  Psych: Denies SI/HI  No problems updated.  ALLERGIES: No Known Allergies  PAST MEDICAL HISTORY: Past Medical History:  Diagnosis Date  . Medical history non-contributory     MEDICATIONS AT HOME: Prior to Admission medications   Medication Sig Start Date End Date Taking? Authorizing Provider  fluticasone (FLONASE) 50 MCG/ACT nasal spray Place 2 sprays into both nostrils daily. 01/24/17  Yes McClung, Angela M, PA-C  ibuprofen (ADVIL,MOTRIN) 600 MG tablet Take 1 tablet (600 mg total) by mouth every 6 (six) hours. 06/25/16  Yes Dove, Leanora Ivanoff, MD  carbamide peroxide (DEBROX) 6.5 % OTIC solution Place 5 drops in each ear for 2 nights before your next visit 01/24/17   Anders Simmonds, PA-C  promethazine (PHENERGAN) 12.5 MG tablet Take 1 tablet (12.5 mg total) by mouth every 6 (six) hours as needed for nausea or vomiting. Patient not taking: Reported on 04/13/2016 11/14/15    Judeth Horn, NP  Vitamin D, Ergocalciferol, (DRISDOL) 50000 units CAPS capsule Take 1 capsule (50,000 Units total) by mouth every 7 (seven) days. Patient not taking: Reported on 02/07/2017 01/29/17   Anders Simmonds, PA-C     Objective:  EXAM:   Vitals:   02/07/17 1612  BP: 94/60  Pulse: 77  Resp: 18  Temp: 98.1 F (36.7 C)  TempSrc: Oral  SpO2: 98%  Weight: 153 lb (69.4 kg)  Height:  (1.651 m)    General appearance : A&OX3. NAD. Non-toxic-appearing HEENT: Atraumatic and Normocephalic.  PERRLA. EOM intact.   Chest/Lungs:  Breathing-non-labored, Good air entry bilaterally, breath sounds normal without rales, rhonchi, or wheezing  CVS: S1 S2 regular, no murmurs, gallops, rubs . Extremities: Bilateral Lower Ext shows no edema, both legs are warm to touch with = pulse throughout Neurology:  CN II-XII grossly intact, Non focal.   Psych:  TP linear. J/I WNL. Normal speech. Appropriate eye contact and affect.  Skin:  No Rash  Procedure: B ears irrigated multiple times w/o success.  administered Debrox B and waited 10-15 mins before further attempts.  Irrigation B successful.  TM WNL B.    Data Review No results found for: HGBA1C   Assessment & Plan   1. Bilateral impacted cerumen Resolved.  Patient in office 45 minutes and 25 mins face to face with irrigation done by me and assisted by [place], CMA.  Hearing much improved.  Patient have been counseled extensively about nutrition and exercise  Return in about 2 months (around 04/09/2017) for assign new pcp.  The patient was given clear instructions to go to ER or return to medical center if symptoms don't improve, worsen or new problems develop. The patient verbalized understanding. The patient was told to call to get lab results if they haven't heard anything in the next week.     Georgian Co, PA-C [facility] [place], [place] [phone]   02/07/2017, 4:48 PM

## 2017-04-09 ENCOUNTER — Ambulatory Visit: Payer: Self-pay | Admitting: Family Medicine

## 2017-04-18 ENCOUNTER — Ambulatory Visit: Payer: Medicaid Other | Attending: Family Medicine | Admitting: Family Medicine

## 2017-04-24 ENCOUNTER — Encounter: Payer: Self-pay | Admitting: Family Medicine

## 2017-04-24 ENCOUNTER — Ambulatory Visit: Payer: Medicaid Other | Attending: Family Medicine | Admitting: Family Medicine

## 2017-04-24 VITALS — BP 105/72 | HR 77 | Temp 98.5°F | Resp 16 | Wt 153.6 lb

## 2017-04-24 DIAGNOSIS — Z8639 Personal history of other endocrine, nutritional and metabolic disease: Secondary | ICD-10-CM | POA: Diagnosis not present

## 2017-04-24 DIAGNOSIS — G8929 Other chronic pain: Secondary | ICD-10-CM | POA: Diagnosis not present

## 2017-04-24 DIAGNOSIS — Z79899 Other long term (current) drug therapy: Secondary | ICD-10-CM | POA: Diagnosis not present

## 2017-04-24 DIAGNOSIS — R51 Headache: Secondary | ICD-10-CM | POA: Insufficient documentation

## 2017-04-24 DIAGNOSIS — J309 Allergic rhinitis, unspecified: Secondary | ICD-10-CM | POA: Insufficient documentation

## 2017-04-24 DIAGNOSIS — R519 Headache, unspecified: Secondary | ICD-10-CM

## 2017-04-24 DIAGNOSIS — E559 Vitamin D deficiency, unspecified: Secondary | ICD-10-CM | POA: Diagnosis not present

## 2017-04-24 DIAGNOSIS — M545 Low back pain: Secondary | ICD-10-CM | POA: Insufficient documentation

## 2017-04-24 MED ORDER — IBUPROFEN 600 MG PO TABS: 600.0000 mg | ORAL_TABLET | Freq: Three times a day (TID) | ORAL | 0 refills | Status: DC | PRN

## 2017-04-24 MED ORDER — LORATADINE 10 MG PO TABS: 10.0000 mg | ORAL_TABLET | Freq: Every day | ORAL | 11 refills | Status: DC

## 2017-04-24 MED ORDER — FLUTICASONE PROPIONATE 50 MCG/ACT NA SUSP: 2.0000 | Freq: Every day | NASAL | 6 refills | Status: DC

## 2017-04-24 NOTE — Progress Notes (Deleted)
Subjective:   Patient ID: Laura Warren, female    DOB: 01/10/1981, 36 y.o.   MRN: [id number]  Chief Complaint  Patient presents with  . re-establish   HPI Laura Warren 36 y.o. female presents with ***  Past Medical History:  Diagnosis Date  . Medical history non-contributory     Past Surgical History:  Procedure Laterality Date  . APPENDECTOMY      Family History  Problem Relation Age of Onset  . Cancer Mother     Social History   Socioeconomic History  . Marital status: Married    Spouse name: Not on file  . Number of children: Not on file  . Years of education: Not on file  . Highest education level: Not on file  Social Needs  . Financial resource strain: Not on file  . Food insecurity - worry: Not on file  . Food insecurity - inability: Not on file  . Transportation needs - medical: Not on file  . Transportation needs - non-medical: Not on file  Occupational History  . Not on file  Tobacco Use  . Smoking status: Never Smoker  . Smokeless tobacco: Never Used  Substance and Sexual Activity  . Alcohol use: No  . Drug use: No  . Sexual activity: Yes    Birth control/protection: None  Other Topics Concern  . Not on file  Social History Narrative  . Not on file    Outpatient Medications Prior to Visit  Medication Sig Dispense Refill  . carbamide peroxide (DEBROX) 6.5 % OTIC solution Place 5 drops in each ear for 2 nights before your next visit (Patient not taking: Reported on 04/24/2017) 15 mL 0  . fluticasone (FLONASE) 50 MCG/ACT nasal spray Place 2 sprays into both nostrils daily. 16 g 6  . ibuprofen (ADVIL,MOTRIN) 600 MG tablet Take 1 tablet (600 mg total) by mouth every 6 (six) hours. (Patient not taking: Reported on 04/24/2017) 30 tablet 0  . promethazine (PHENERGAN) 12.5 MG tablet Take 1 tablet (12.5 mg total) by mouth every 6 (six) hours as needed for nausea or vomiting. (Patient not taking: Reported on 04/13/2016) 30 tablet 0  . Vitamin D,  Ergocalciferol, (DRISDOL) 50000 units CAPS capsule Take 1 capsule (50,000 Units total) by mouth every 7 (seven) days. (Patient not taking: Reported on 04/24/2017) 16 capsule 0   No facility-administered medications prior to visit.     No Known Allergies  ROS     Objective:    Physical Exam  BP 105/72   Pulse 77   Temp 98.5 F (36.9 C) (Oral)   Resp 16   Wt 153 lb 9.6 oz (69.7 kg)   SpO2 99%   BMI 25.56 kg/m  Wt Readings from Last 3 Encounters:  04/24/17 153 lb 9.6 oz (69.7 kg)  02/07/17 153 lb (69.4 kg)  01/24/17 155 lb (70.3 kg)    Immunization History  Administered Date(s) Administered  . Influenza-Unspecified 03/26/2016  . Tdap 03/26/2016    Diabetic Foot Exam - Simple   No data filed      Lab Results  Component Value Date   TSH <0.006 (L) 01/24/2017   Lab Results  Component Value Date   WBC 6.1 01/24/2017   HGB 13.3 01/24/2017   HCT 40.9 01/24/2017   MCV 85 01/24/2017   PLT 210 01/24/2017   Lab Results  Component Value Date   NA 137 01/24/2017   K 3.9 01/24/2017   CO2 26 01/24/2017   GLUCOSE 126 (H) 01/24/2017  BUN 9 01/24/2017   CREATININE 0.65 01/24/2017   BILITOT 0.3 01/24/2017   ALKPHOS 70 01/24/2017   AST 18 01/24/2017   ALT 17 01/24/2017   PROT 6.4 01/24/2017   ALBUMIN 4.0 01/24/2017   CALCIUM 9.0 01/24/2017   ANIONGAP 6 11/14/2015   Lab Results  Component Value Date   CHOL 178 07/24/2013   Lab Results  Component Value Date   HDL 46 07/24/2013   Lab Results  Component Value Date   LDLCALC 116 (H) 07/24/2013   Lab Results  Component Value Date   TRIG 80 07/24/2013   Lab Results  Component Value Date   CHOLHDL 3.9 07/24/2013   No results found for: HGBA1C     Assessment & Plan:   Problem List Items Addressed This Visit    None      I am having Laura Warren maintain her promethazine, ibuprofen, fluticasone, carbamide peroxide, and Vitamin D (Ergocalciferol).  No orders of the defined types were placed in  this encounter.  Arrie SenateMandesia Hairston, FNP   Headaches per week to 2 to 3  2 months ago  Seasonal allergies  No sinus infection  Not taking anything  8/10 worst  No ear or nasal drainage No migarines    Onset  10 months ago  Back pain  Lumbar  Prolonged standing  No sciatica  NKI  6-7/10   Vitamin d 3 months   1 month

## 2017-04-24 NOTE — Patient Instructions (Signed)

## 2017-04-25 LAB — VITAMIN D 25 HYDROXY (VIT D DEFICIENCY, FRACTURES): Vit D, 25-Hydroxy: 18.1 ng/mL — ABNORMAL LOW (ref 30.0–100.0)

## 2017-04-30 NOTE — Progress Notes (Signed)
Subjective:  Patient ID: Laura Warren, female    DOB: 01/20/1981  Age: 36 y.o. MRN: [id number]  CC: re-establish   HPI Laura PhilipsSubhadra Bradeen presents to establish care. She c/o headaches. Onset 2 months ago. Reports 2 to 3 episodes per week. She denies any visual disturbances, nausea, vomiting, or weakness. She does reports nasal congestion and rhinorrhea. She denies any history of frequent sinus infections. Pain 8/10 at worst. She denies taking anything for symptoms. Back pain. Onset 10 months ago. Lumbar back. Denies any associated symptoms. Symptoms aggravated with prolonged standing. NKI. Pain 6-7/10. History of vitamin d deficiency. She reports taking vitamin d supplements.    Outpatient Medications Prior to Visit  Medication Sig Dispense Refill  . carbamide peroxide (DEBROX) 6.5 % OTIC solution Place 5 drops in each ear for 2 nights before your next visit (Patient not taking: Reported on 04/24/2017) 15 mL 0  . promethazine (PHENERGAN) 12.5 MG tablet Take 1 tablet (12.5 mg total) by mouth every 6 (six) hours as needed for nausea or vomiting. (Patient not taking: Reported on 04/13/2016) 30 tablet 0  . Vitamin D, Ergocalciferol, (DRISDOL) 50000 units CAPS capsule Take 1 capsule (50,000 Units total) by mouth every 7 (seven) days. (Patient not taking: Reported on 04/24/2017) 16 capsule 0  . fluticasone (FLONASE) 50 MCG/ACT nasal spray Place 2 sprays into both nostrils daily. 16 g 6  . ibuprofen (ADVIL,MOTRIN) 600 MG tablet Take 1 tablet (600 mg total) by mouth every 6 (six) hours. (Patient not taking: Reported on 04/24/2017) 30 tablet 0   No facility-administered medications prior to visit.     ROS Review of Systems  Constitutional: Negative.   HENT: Negative.   Eyes: Negative.   Respiratory: Negative.   Cardiovascular: Negative.   Musculoskeletal: Positive for back pain.  Neurological: Positive for headaches.        Objective:  BP 105/72   Pulse 77   Temp 98.5 F (36.9 C)  (Oral)   Resp 16   Wt 153 lb 9.6 oz (69.7 kg)   SpO2 99%   BMI 25.56 kg/m   BP/Weight 04/24/2017 02/07/2017 01/24/2017  Systolic BP 105 94 96  Diastolic BP 72 60 61  Wt. (Lbs) 153.6 153 155  BMI 25.56 25.46 25.79     Physical Exam  Constitutional: She is oriented to person, place, and time. She appears well-developed and well-nourished.  HENT:  Head: Normocephalic and atraumatic.  Right Ear: External ear normal.  Left Ear: External ear normal.  Nose: Mucosal edema and rhinorrhea present.  Mouth/Throat: Oropharynx is clear and moist.  Eyes: Conjunctivae and EOM are normal. Pupils are equal, round, and reactive to light.  Neck: Normal range of motion. Neck supple.  Cardiovascular: Normal rate, regular rhythm, normal heart sounds and intact distal pulses.  Pulmonary/Chest: Effort normal and breath sounds normal.  Abdominal: Soft. Bowel sounds are normal.  Musculoskeletal: Normal range of motion. She exhibits no tenderness.  Neurological: She is alert and oriented to person, place, and time. She has normal reflexes.  Nursing note and vitals reviewed.    Assessment & Plan:   1. Generalized headaches  - ibuprofen (ADVIL,MOTRIN) 600 MG tablet; Take 1 tablet (600 mg total) by mouth every 8 (eight) hours as needed.  Dispense: 40 tablet; Refill: 0  2. Chronic allergic rhinitis  - loratadine (CLARITIN) 10 MG tablet; Take 1 tablet (10 mg total) by mouth daily.  Dispense: 30 tablet; Refill: 11 - fluticasone (FLONASE) 50 MCG/ACT nasal spray; Place 2 sprays into  both nostrils daily.  Dispense: 16 g; Refill: 6  3. Chronic midline low back pain without sciatica  - ibuprofen (ADVIL,MOTRIN) 600 MG tablet; Take 1 tablet (600 mg total) by mouth every 8 (eight) hours as needed.  Dispense: 40 tablet; Refill: 0  4. Nonintractable headache, unspecified chronicity pattern, unspecified headache type  - fluticasone (FLONASE) 50 MCG/ACT nasal spray; Place 2 sprays into both nostrils daily.   Dispense: 16 g; Refill: 6  5. History of vitamin D deficiency  - Vitamin D, 25-hydroxy      Follow-up: Return in about 1 month (around 05/25/2017), or if symptoms worsen or fail to improve, for Physical.   Lizbeth BarkMandesia R Benjamine Strout FNP

## 2017-05-01 ENCOUNTER — Telehealth: Payer: Self-pay

## 2017-05-01 ENCOUNTER — Other Ambulatory Visit: Payer: Self-pay | Admitting: Family Medicine

## 2017-05-01 DIAGNOSIS — E559 Vitamin D deficiency, unspecified: Secondary | ICD-10-CM

## 2017-05-01 NOTE — Telephone Encounter (Signed)
Pt was called and informed of lab results. 

## 2017-05-02 ENCOUNTER — Ambulatory Visit: Payer: Medicaid Other | Attending: Family Medicine

## 2017-05-02 DIAGNOSIS — E559 Vitamin D deficiency, unspecified: Secondary | ICD-10-CM | POA: Diagnosis not present

## 2017-05-02 NOTE — Progress Notes (Signed)
Patient here for lab visit only 

## 2017-05-03 LAB — BASIC METABOLIC PANEL
BUN / CREAT RATIO: 16 (ref 9–23)
BUN: 10 mg/dL (ref 6–20)
CHLORIDE: 106 mmol/L (ref 96–106)
CO2: 22 mmol/L (ref 20–29)
CREATININE: 0.63 mg/dL (ref 0.57–1.00)
Calcium: 9 mg/dL (ref 8.7–10.2)
GFR calc non Af Amer: 116 mL/min/{1.73_m2} (ref >59)
GFR, EST AFRICAN AMERICAN: 133 mL/min/{1.73_m2} (ref >59)
Glucose: 95 mg/dL (ref 65–99)
Potassium: 3.8 mmol/L (ref 3.5–5.2)
Sodium: 140 mmol/L (ref 134–144)

## 2017-05-03 LAB — PTH, INTACT AND CALCIUM: PTH: 19 pg/mL (ref 15–65)

## 2017-05-09 ENCOUNTER — Telehealth: Payer: Self-pay | Admitting: *Deleted

## 2017-05-09 NOTE — Telephone Encounter (Signed)
Patient verified DOB Patient is aware of thyroid and kidney being normal. No further questions.

## 2017-05-09 NOTE — Telephone Encounter (Signed)
-----   Message from Lizbeth BarkMandesia R Hairston, FNP sent at 05/08/2017  2:07 PM EST ----- Kidney function normal. Parathyroid function normal.

## 2017-06-12 IMAGING — US US OB NUCHAL TRANSLUCENCY 1ST GEST
1 series · 15 of 28 positions shown · non-contrast
Comparison: none

[Series 1: us ob nuchal translucency 1st gest · 15 of 39 slices shown]
[im 1/39]
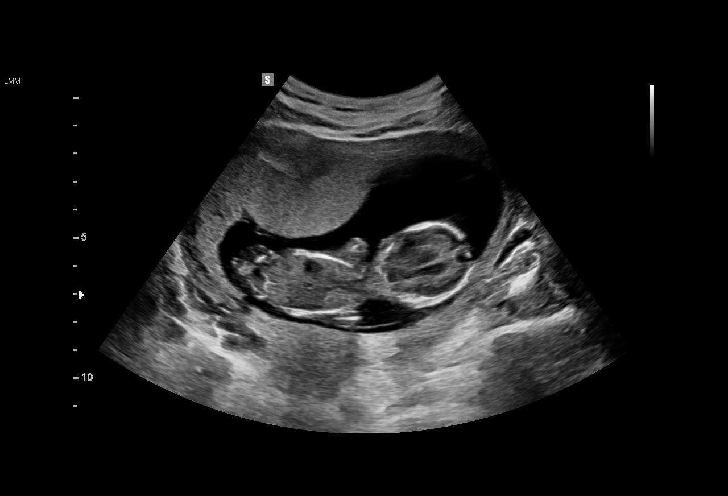
[im 3/39]
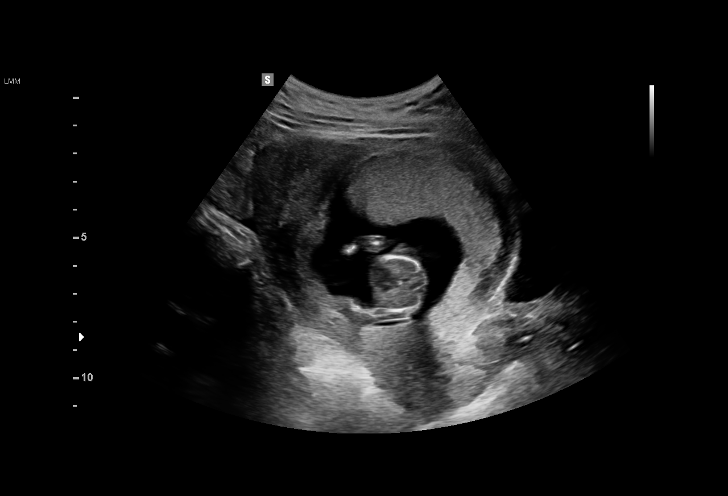
[im 6/39]
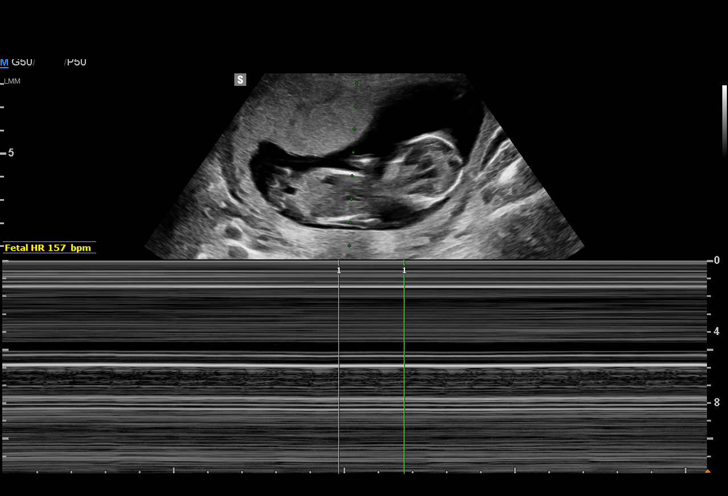
[im 9/39]
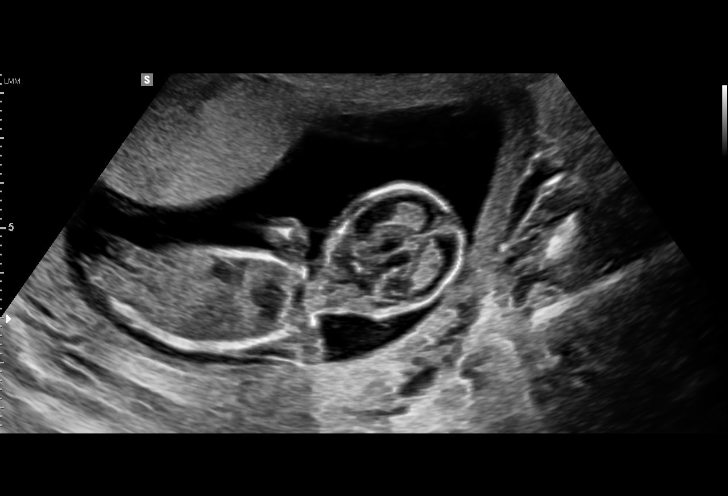
[im 12/39]
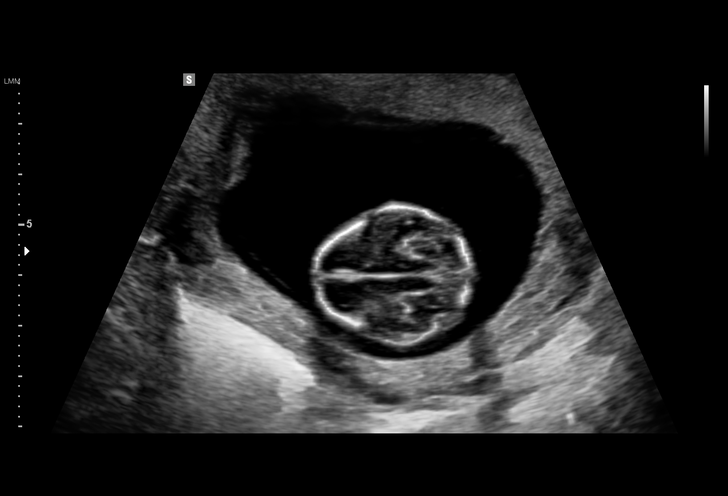
[im 15/39]
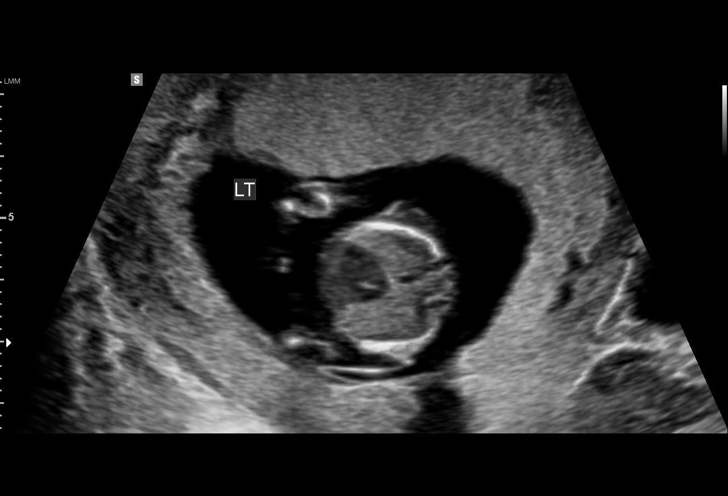
[im 17/39]
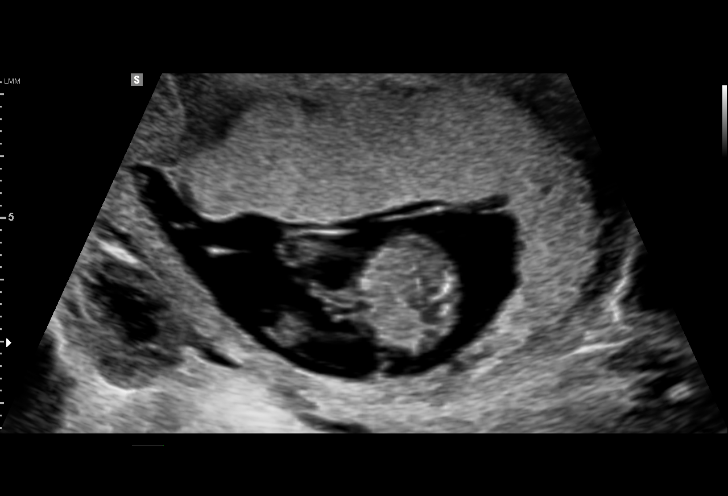
[im 20/39]
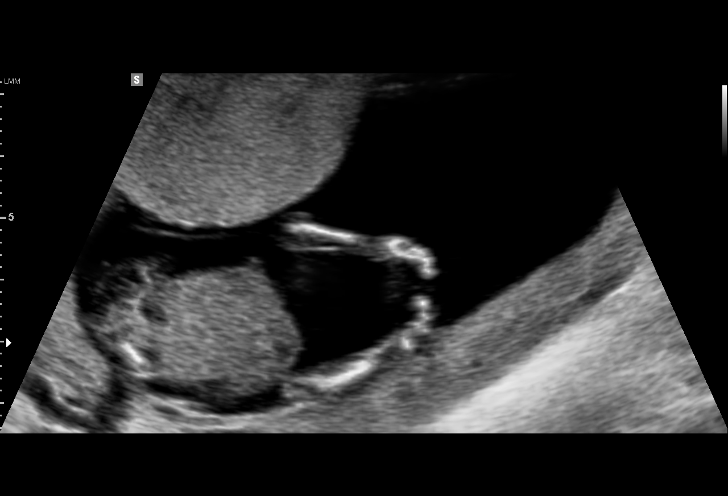
[im 22/39]
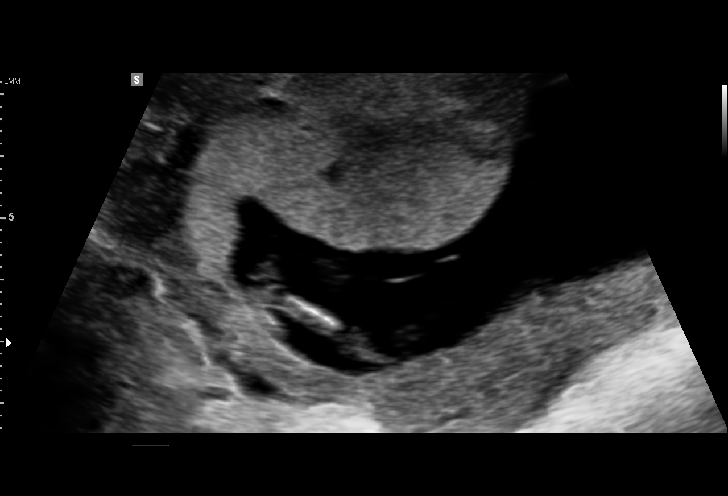
[im 24/39]
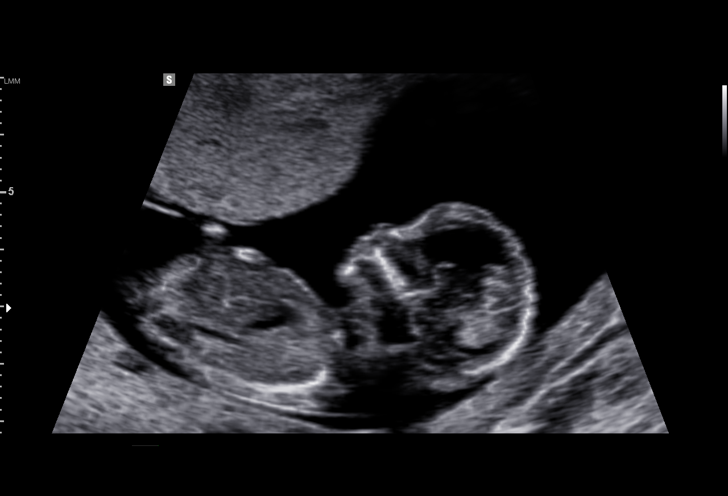
[im 27/39]
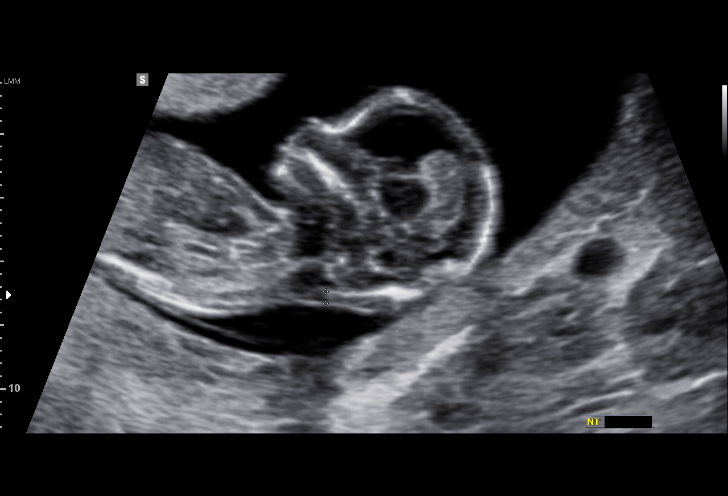
[im 30/39]
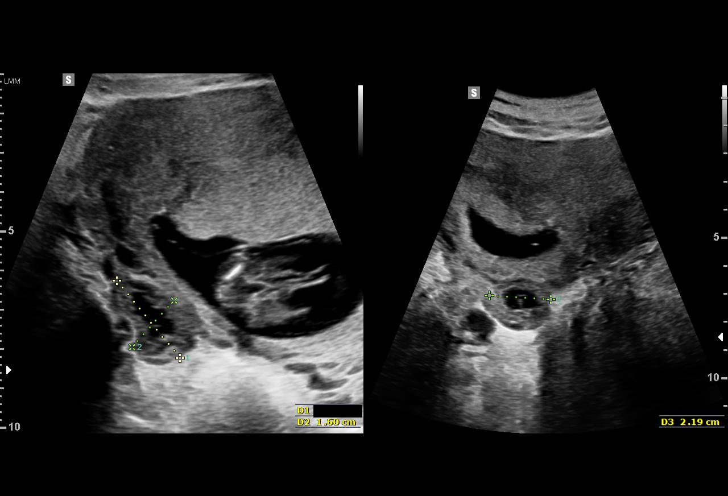
[im 33/39]
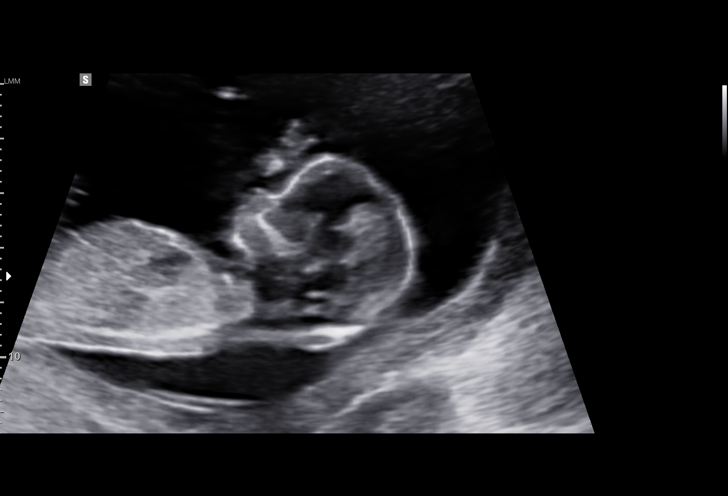
[im 36/39]
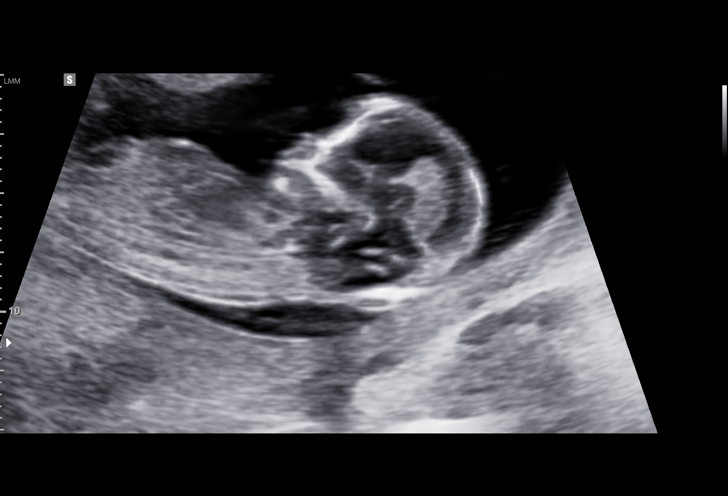
[im 39/39]
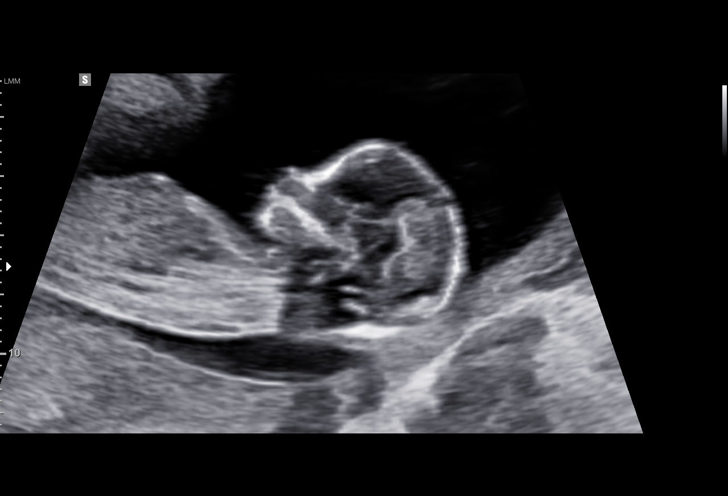

[15 of 28 positions shown; findings below may reference images not displayed]

[REDACTED]-
Faculty Physician

1  US FETAL NUCHAL TRANSLUCENCY                76813.0
MEASUREMENT

1  RACHNA DG            [id number]      [id number]     [id number]
Indications

13 weeks gestation of pregnancy
First trimester aneuploidy screen (NT)         Z36
Advanced maternal age multigravida 35+,
first trimester
OB History

Gravidity:    2         Term:   1
Living:       1
Fetal Evaluation

Num Of Fetuses:     1
Fetal Heart         157
Rate(bpm):
Cardiac Activity:   Observed
Presentation:       Variable
Placenta:           Anterior, above cervical os

Amniotic Fluid
AFI FV:      Subjectively within normal limits
Biometry

CRL:        84  mm     G. Age:  13w 6d                   EDD:   06/16/16

NT:        1.9  mm
Gestational Age
LMP:           13w 4d        Date:  09/12/15                 EDD:    06/18/16
Best:          13w 4d     Det. By:  LMP  (09/12/15)          EDD:    06/18/16
Anatomy

Cranium:               Appears normal         Abdominal Wall:         Appears nml (cord
insert, abd wall)
Choroid Plexus:        Appears normal         Bladder:                Appears normal
Thoracic:              Appears normal         Upper Extremities:      Visualized
Stomach:               Appears normal, left   Lower Extremities:      Visualized
sided
Abdomen:               Appears normal
Cervix Uterus Adnexa

Cervix
Normal appearance by transabdominal scan.

Uterus
No abnormality visualized.

Left Ovary
Not visualized.

Right Ovary
Within normal limits.

Cul De Sac:   No free fluid seen.

Adnexa:       No abnormality visualized.
Impression

SIUP at 13+4 weeks
No gross abnormalities identified
NT measurement was within normal limits for this GA; NB
present
Normal amniotic fluid volume
Measurements consistent with LMP dating

Ms. Krbck declined screening.
Recommendations

Offer MSAFP in the second trimester for ONTD screening
Offer detailed U/S by 18 weeks

## 2017-07-11 ENCOUNTER — Ambulatory Visit: Payer: Self-pay

## 2017-10-03 ENCOUNTER — Other Ambulatory Visit (HOSPITAL_COMMUNITY)
Admission: RE | Admit: 2017-10-03 | Discharge: 2017-10-03 | Disposition: A | Discharge status: Home / Self Care | Payer: Medicaid Other | Source: Ambulatory Visit | Attending: Family Medicine | Admitting: Family Medicine

## 2017-10-03 ENCOUNTER — Ambulatory Visit: Payer: Medicaid Other | Attending: Family Medicine | Admitting: Physician Assistant

## 2017-10-03 VITALS — BP 104/68 | HR 70 | Temp 98.6°F | Resp 16 | Ht 65.0 in | Wt 143.0 lb

## 2017-10-03 DIAGNOSIS — R102 Pelvic and perineal pain: Secondary | ICD-10-CM | POA: Diagnosis present

## 2017-10-03 DIAGNOSIS — R300 Dysuria: Secondary | ICD-10-CM | POA: Diagnosis not present

## 2017-10-03 DIAGNOSIS — N3000 Acute cystitis without hematuria: Secondary | ICD-10-CM | POA: Insufficient documentation

## 2017-10-03 DIAGNOSIS — E559 Vitamin D deficiency, unspecified: Secondary | ICD-10-CM | POA: Insufficient documentation

## 2017-10-03 LAB — POCT URINALYSIS DIPSTICK
BILIRUBIN UA: NEGATIVE
Blood, UA: NEGATIVE
Glucose, UA: NEGATIVE
KETONES UA: NEGATIVE
NITRITE UA: NEGATIVE
PROTEIN UA: NEGATIVE
Spec Grav, UA: 1.015 (ref 1.010–1.025)
Urobilinogen, UA: 0.2 E.U./dL
pH, UA: 5.5 (ref 5.0–8.0)

## 2017-10-03 LAB — POCT URINE PREGNANCY: Preg Test, Ur: NEGATIVE

## 2017-10-03 MED ORDER — DOXYCYCLINE HYCLATE 100 MG PO TABS: 100.0000 mg | ORAL_TABLET | Freq: Two times a day (BID) | ORAL | 0 refills | Status: DC

## 2017-10-03 MED ORDER — FLUCONAZOLE 150 MG PO TABS: 150.0000 mg | ORAL_TABLET | Freq: Once | ORAL | 0 refills | Status: AC

## 2017-10-03 NOTE — Progress Notes (Signed)
Pt. Stated she have side pain. Pt. Stated last week she had pain when she urinates.

## 2017-10-03 NOTE — Patient Instructions (Signed)

## 2017-10-03 NOTE — Progress Notes (Signed)
Patient ID: Laura Warren, female   DOB: Oct 02, 1980, 37 y.o.   MRN: [id number]   Fannie Gathright, is a 37 y.o. female  [id number]  [id number]  DOB - [birth year]  Subjective:  Chief Complaint and HPI: Laura Warren is a 37 y.o. female here today with 1 week h/o bladder discomfort and dysuria with some mild pelvic pain.  No f/c.  No N/V/D.  Just stopped nursing 61 month old baby.  No vaginal discharge.    Also wants to check vitamin D levels   ROS:   Constitutional:  No f/c, No night sweats, No unexplained weight loss. EENT:  No vision changes, No blurry vision, No hearing changes. No mouth, throat, or ear problems.  Respiratory: No cough, No SOB Cardiac: No CP, no palpitations GI:  No abd pain, No N/V/D. GU: + Urinary s/sx Musculoskeletal: No joint pain Neuro: No headache, no dizziness, no motor weakness.  Skin: No rash Endocrine:  No polydipsia. No polyuria.  Psych: Denies SI/HI  No problems updated.  ALLERGIES: No Known Allergies  PAST MEDICAL HISTORY: Past Medical History:  Diagnosis Date  . Medical history non-contributory     MEDICATIONS AT HOME: Prior to Admission medications   Medication Sig Start Date End Date Taking? Authorizing Provider  carbamide peroxide (DEBROX) 6.5 % OTIC solution Place 5 drops in each ear for 2 nights before your next visit Patient not taking: Reported on 04/24/2017 01/24/17   Anders Simmonds, PA-C  doxycycline (VIBRA-TABS) 100 MG tablet Take 1 tablet (100 mg total) by mouth 2 (two) times daily. 10/03/17   Anders Simmonds, PA-C  fluconazole (DIFLUCAN) 150 MG tablet Take 1 tablet (150 mg total) by mouth once for 1 dose. 10/03/17 10/03/17  Anders Simmonds, PA-C  fluticasone (FLONASE) 50 MCG/ACT nasal spray Place 2 sprays into both nostrils daily. Patient not taking: Reported on 10/03/2017 04/24/17   Lizbeth Bark, FNP  ibuprofen (ADVIL,MOTRIN) 600 MG tablet Take 1 tablet (600 mg total) by mouth every 8 (eight) hours as  needed. Patient not taking: Reported on 10/03/2017 04/24/17   Lizbeth Bark, FNP  loratadine (CLARITIN) 10 MG tablet Take 1 tablet (10 mg total) by mouth daily. Patient not taking: Reported on 10/03/2017 04/24/17   Lizbeth Bark, FNP  promethazine (PHENERGAN) 12.5 MG tablet Take 1 tablet (12.5 mg total) by mouth every 6 (six) hours as needed for nausea or vomiting. Patient not taking: Reported on 04/13/2016 11/14/15   Judeth Horn, NP  Vitamin D, Ergocalciferol, (DRISDOL) 50000 units CAPS capsule Take 1 capsule (50,000 Units total) by mouth every 7 (seven) days. Patient not taking: Reported on 04/24/2017 01/29/17   Anders Simmonds, PA-C     Objective:  EXAM:   Vitals:   10/03/17 1508  BP: 104/68  Pulse: 70  Resp: 16  Temp: 98.6 F (37 C)  TempSrc: Oral  SpO2: 97%  Weight: 143 lb (64.9 kg)  Height:  (1.651 m)    General appearance : A&OX3. NAD. Non-toxic-appearing HEENT: Atraumatic and Normocephalic.  PERRLA. EOM intact. Neck: supple, no JVD. No cervical lymphadenopathy. No thyromegaly Chest/Lungs:  Breathing-non-labored, Good air entry bilaterally, breath sounds normal without rales, rhonchi, or wheezing  CVS: S1 S2 regular, no murmurs, gallops, rubs  Abdomen: Bowel sounds present, Non tender and not distended with no gaurding, rigidity or rebound. Extremities: Bilateral Lower Ext shows no edema, both legs are warm to touch with = pulse throughout Neurology:  CN II-XII grossly intact, Non focal.   Psych:  TP linear. J/I WNL. Normal speech. Appropriate eye contact and affect.  Skin:  No Rash  Data Review No results found for: HGBA1C   Assessment & Plan   1. Dysuria With UTI-see #3 - Urinalysis Dipstick - POCT urine pregnancy  2. Vitamin D deficiency Finished Rx vitamin D - Vitamin D, 25-hydroxy  3. Acute cystitis without hematuria Increase water intake.  She has stopped nursing her infant who is now 24 months old.   - Urine Culture - Urine  cytology ancillary only - doxycycline (VIBRA-TABS) 100 MG tablet; Take 1 tablet (100 mg total) by mouth 2 (two) times daily.  Dispense: 20 tablet; Refill: 0 - fluconazole (DIFLUCAN) 150 MG tablet; Take 1 tablet (150 mg total) by mouth once for 1 dose.  Dispense: 1 tablet; Refill: 0   Patient have been counseled extensively about nutrition and exercise  Return in about 3 months (around 01/03/2018) for assign PCP.  The patient was given clear instructions to go to ER or return to medical center if symptoms don't improve, worsen or new problems develop. The patient verbalized understanding. The patient was told to call to get lab results if they haven't heard anything in the next week.     Georgian Co, PA-C [facility] [place], [place] [phone]   10/03/2017, 3:34 PM

## 2017-10-04 LAB — URINE CYTOLOGY ANCILLARY ONLY
CHLAMYDIA, DNA PROBE: NEGATIVE
Neisseria Gonorrhea: NEGATIVE
Trichomonas: NEGATIVE

## 2017-10-04 LAB — VITAMIN D 25 HYDROXY (VIT D DEFICIENCY, FRACTURES): Vit D, 25-Hydroxy: 23.7 ng/mL — ABNORMAL LOW (ref 30.0–100.0)

## 2017-10-05 LAB — URINE CULTURE: Organism ID, Bacteria: NO GROWTH

## 2017-10-08 LAB — URINE CYTOLOGY ANCILLARY ONLY: CANDIDA VAGINITIS: NEGATIVE

## 2017-10-09 ENCOUNTER — Other Ambulatory Visit: Payer: Self-pay | Admitting: Physician Assistant

## 2017-10-09 MED ORDER — METRONIDAZOLE 500 MG PO TABS: 500.0000 mg | ORAL_TABLET | Freq: Two times a day (BID) | ORAL | 0 refills | Status: DC

## 2017-10-09 MED ORDER — VITAMIN D (ERGOCALCIFEROL) 1.25 MG (50000 UNIT) PO CAPS: 50000.0000 [IU] | ORAL_CAPSULE | ORAL | 0 refills | Status: DC

## 2017-10-14 ENCOUNTER — Telehealth: Payer: Self-pay

## 2017-10-14 NOTE — Telephone Encounter (Signed)
-----   Message from Anders SimmondsAngela M McClung, [place]-C sent at 10/09/2017  8:41 AM EDT ----- Please call patient and let them that their vitamin D is low.  This can contribute to muscle aches, anxiety, fatigue, and depression.  I have sent a prescription to the pharmacy for them to take once a week.  We will recheck this level in 3-4 months.  You can stop the doxycycline.  The urine showed overgrowth from the vagina, so, I sent her a different antibiotic to take. No STD.  Follow-up as planned.  Thanks, Georgian CoAngela McClung, PA-C

## 2017-10-14 NOTE — Telephone Encounter (Signed)
CMA spoke to patient to inform her lab results and new Rx.   Pt. Stated she finished her Doxycycline and would like to know if she still need to take the Metronidazole.

## 2017-10-16 NOTE — Telephone Encounter (Signed)
CMA spoke to patient to inform to take the metronidazole. Pt. Understood.

## 2017-10-16 NOTE — Telephone Encounter (Signed)
Yes, finish the metronidazole.

## 2017-10-29 ENCOUNTER — Encounter: Payer: Self-pay | Admitting: Nurse Practitioner

## 2017-10-29 ENCOUNTER — Ambulatory Visit: Payer: Medicaid Other | Attending: Nurse Practitioner | Admitting: Nurse Practitioner

## 2017-10-29 ENCOUNTER — Other Ambulatory Visit (HOSPITAL_COMMUNITY)
Admission: RE | Admit: 2017-10-29 | Discharge: 2017-10-29 | Disposition: A | Discharge status: Home / Self Care | Payer: Medicaid Other | Source: Ambulatory Visit | Attending: Nurse Practitioner | Admitting: Nurse Practitioner

## 2017-10-29 VITALS — BP 101/69 | HR 70 | Temp 98.6°F | Ht 65.0 in | Wt 141.4 lb

## 2017-10-29 DIAGNOSIS — Z9049 Acquired absence of other specified parts of digestive tract: Secondary | ICD-10-CM | POA: Diagnosis not present

## 2017-10-29 DIAGNOSIS — E559 Vitamin D deficiency, unspecified: Secondary | ICD-10-CM

## 2017-10-29 DIAGNOSIS — R110 Nausea: Secondary | ICD-10-CM | POA: Insufficient documentation

## 2017-10-29 DIAGNOSIS — N760 Acute vaginitis: Secondary | ICD-10-CM | POA: Diagnosis not present

## 2017-10-29 DIAGNOSIS — B9689 Other specified bacterial agents as the cause of diseases classified elsewhere: Secondary | ICD-10-CM | POA: Insufficient documentation

## 2017-10-29 DIAGNOSIS — Z79899 Other long term (current) drug therapy: Secondary | ICD-10-CM | POA: Diagnosis not present

## 2017-10-29 DIAGNOSIS — R102 Pelvic and perineal pain: Secondary | ICD-10-CM | POA: Diagnosis not present

## 2017-10-29 DIAGNOSIS — L249 Irritant contact dermatitis, unspecified cause: Secondary | ICD-10-CM | POA: Diagnosis not present

## 2017-10-29 MED ORDER — VITAMIN D (ERGOCALCIFEROL) 1.25 MG (50000 UNIT) PO CAPS: 50000.0000 [IU] | ORAL_CAPSULE | ORAL | 0 refills | Status: DC

## 2017-10-29 MED ORDER — TRIAMCINOLONE ACETONIDE 0.025 % EX LOTN: TOPICAL_LOTION | CUTANEOUS | 1 refills | Status: DC

## 2017-10-29 MED ORDER — PHENAZOPYRIDINE HCL 95 MG PO TABS: 95.0000 mg | ORAL_TABLET | Freq: Three times a day (TID) | ORAL | 0 refills | Status: AC | PRN

## 2017-10-29 NOTE — Patient Instructions (Signed)
Contact Dermatitis Dermatitis is redness, soreness, and swelling (inflammation) of the skin. Contact dermatitis is a reaction to certain substances that touch the skin. You either touched something that irritated your skin, or you have allergies to something you touched. Follow these instructions at home: Skin Care  Moisturize your skin as needed.  Apply cool compresses to the affected areas.  Try taking a bath with: ? Epsom salts. Follow the instructions on the package. You can get these at a pharmacy or grocery store. ? Baking soda. Pour a small amount into the bath as told by your doctor. ? Colloidal oatmeal. Follow the instructions on the package. You can get this at a pharmacy or grocery store.  Try applying baking soda paste to your skin. Stir water into baking soda until it looks like paste.  Do not scratch your skin.  Bathe less often.  Bathe in lukewarm water. Avoid using hot water. Medicines  Take or apply over-the-counter and prescription medicines only as told by your doctor.  If you were prescribed an antibiotic medicine, take or apply your antibiotic as told by your doctor. Do not stop taking the antibiotic even if your condition starts to get better. General instructions  Keep all follow-up visits as told by your doctor. This is important.  Avoid the substance that caused your reaction. If you do not know what caused it, keep a journal to try to track what caused it. Write down: ? What you eat. ? What cosmetic products you use. ? What you drink. ? What you wear in the affected area. This includes jewelry.  If you were given a bandage (dressing), take care of it as told by your doctor. This includes when to change and remove it. Contact a doctor if:  You do not get better with treatment.  Your condition gets worse.  You have signs of infection such as: ? Swelling. ? Tenderness. ? Redness. ? Soreness. ? Warmth.  You have a fever.  You have new  symptoms. Get help right away if:  You have a very bad headache.  You have neck pain.  Your neck is stiff.  You throw up (vomit).  You feel very sleepy.  You see red streaks coming from the affected area.  Your bone or joint underneath the affected area becomes painful after the skin has healed.  The affected area turns darker.  You have trouble breathing. This information is not intended to replace advice given to you by your health care provider. Make sure you discuss any questions you have with your health care provider. Document Released: 02/18/2009 Document Revised: 09/29/2015 Document Reviewed: 09/08/2014 Elsevier Interactive Patient Education  2018 Elsevier Inc.  

## 2017-10-29 NOTE — Progress Notes (Signed)
Assessment & Plan:  Karisa was seen today for establish care.  Diagnoses and all orders for this visit:  Pelvic pain in female -     phenazopyridine (PYRIDIUM) 95 MG tablet; Take 1 tablet (95 mg total) by mouth 3 (three) times daily as needed for up to 5 days for pain. -     Urine cytology ancillary only  Irritant contact dermatitis, unspecified trigger -     Triamcinolone Acetonide 0.025 % LOTN; To both arms twice daily.  Vitamin D deficiency -     Vitamin D, Ergocalciferol, (DRISDOL) 50000 units CAPS capsule; Take 1 capsule (50,000 Units total) by mouth every 7 (seven) days.    Patient has been counseled on age-appropriate routine health concerns for screening and prevention. These are reviewed and up-to-date. Referrals have been placed accordingly. Immunizations are up-to-date or declined.    Subjective:   Chief Complaint  Patient presents with  . Establish Care    Pt. is here to establish care. Pt. stated she have lower left abdominal pain. No pain when urinates.    HPI Kalifa Cadden 37 y.o. female presents to office today to establish care. She has complaints today of lower pelvic pain and pressure. She was treated in may for a UTI with antibiotics however culture only showed normal flora. She also has complaints of skin rash on her bilateral upper arms.   Abdominal Pain Patient complains of abdominal pain. The pain is described as pressure-like, and is 4/10 in intensity. Pain is located in the lower pelvic region without radiation. Onset was 1 month ago. Symptoms have been unchanged since. Aggravating factors: urination.  Alleviating factors: none. Associated symptoms: nausea. The patient denies constipation, diarrhea, fever, hematochezia, hematuria, melena and vomiting.  Rash Patient complains of rash involving the bilateral arm. Rash started a few months ago. Appearance of rash at onset: Texture of lesion(s): raised. Rash has not changed over time Initial  distribution: bilateral arm.  Discomfort associated with rash: causes no discomfort.  Associated symptoms: none. Denies: fever. Patient has not had previous evaluation of rash. Patient has not had previous treatment. Patient has not had contacts with similar rash. Patient has not identified precipitant. Patient has had new exposures (soaps, lotions, laundry detergents)  Review of Systems  Constitutional: Negative for fever, malaise/fatigue and weight loss.  HENT: Negative.  Negative for nosebleeds.   Eyes: Negative.  Negative for blurred vision, double vision and photophobia.  Respiratory: Negative.  Negative for cough and shortness of breath.   Cardiovascular: Negative.  Negative for chest pain, palpitations and leg swelling.  Gastrointestinal: Positive for abdominal pain and nausea. Negative for heartburn and vomiting.  Genitourinary: Negative for dysuria, flank pain, frequency, hematuria and urgency.       SEE HPI  Musculoskeletal: Negative.  Negative for myalgias.  Skin: Positive for rash.  Neurological: Negative.  Negative for dizziness, focal weakness, seizures and headaches.  Psychiatric/Behavioral: Negative.  Negative for suicidal ideas.    Past Medical History:  Diagnosis Date  . Medical history non-contributory     Past Surgical History:  Procedure Laterality Date  . APPENDECTOMY      Family History  Problem Relation Age of Onset  . Cancer Mother     Social History Reviewed with no changes to be made today.   Outpatient Medications Prior to Visit  Medication Sig Dispense Refill  . Vitamin D, Ergocalciferol, (DRISDOL) 50000 units CAPS capsule Take 1 capsule (50,000 Units total) by mouth every 7 (seven) days. 16 capsule  0  . promethazine (PHENERGAN) 12.5 MG tablet Take 1 tablet (12.5 mg total) by mouth every 6 (six) hours as needed for nausea or vomiting. (Patient not taking: Reported on 04/13/2016) 30 tablet 0  . carbamide peroxide (DEBROX) 6.5 % OTIC solution Place 5  drops in each ear for 2 nights before your next visit (Patient not taking: Reported on 04/24/2017) 15 mL 0  . doxycycline (VIBRA-TABS) 100 MG tablet Take 1 tablet (100 mg total) by mouth 2 (two) times daily. 20 tablet 0  . fluticasone (FLONASE) 50 MCG/ACT nasal spray Place 2 sprays into both nostrils daily. (Patient not taking: Reported on 10/03/2017) 16 g 6  . ibuprofen (ADVIL,MOTRIN) 600 MG tablet Take 1 tablet (600 mg total) by mouth every 8 (eight) hours as needed. (Patient not taking: Reported on 10/03/2017) 40 tablet 0  . loratadine (CLARITIN) 10 MG tablet Take 1 tablet (10 mg total) by mouth daily. (Patient not taking: Reported on 10/03/2017) 30 tablet 11  . metroNIDAZOLE (FLAGYL) 500 MG tablet Take 1 tablet (500 mg total) by mouth 2 (two) times daily. 14 tablet 0   No facility-administered medications prior to visit.     No Known Allergies     Objective:    BP 101/69 (BP Location: Right Arm, Patient Position: Sitting, Cuff Size: Normal)   Pulse 70   Temp 98.6 F (37 C) (Oral)   Ht 5\' 5"  (1.651 m)   Wt 141 lb 6.4 oz (64.1 kg)   LMP 10/15/2017   SpO2 100%   BMI 23.53 kg/m  Wt Readings from Last 3 Encounters:  10/29/17 141 lb 6.4 oz (64.1 kg)  10/03/17 143 lb (64.9 kg)  04/24/17 153 lb 9.6 oz (69.7 kg)    Physical Exam  Constitutional: She is oriented to person, place, and time. She appears well-developed and well-nourished. She is cooperative.  HENT:  Head: Normocephalic and atraumatic.  Eyes: EOM are normal.  Neck: Normal range of motion.  Cardiovascular: Normal rate, regular rhythm, normal heart sounds and intact distal pulses. Exam reveals no gallop and no friction rub.  No murmur heard. Pulmonary/Chest: Effort normal and breath sounds normal. No tachypnea. No respiratory distress. She has no decreased breath sounds. She has no wheezes. She has no rhonchi. She has no rales. She exhibits no tenderness.  Abdominal: Soft. Bowel sounds are normal. She exhibits no distension  and no mass. There is tenderness in the suprapubic area. There is no rigidity, no rebound, no guarding, no CVA tenderness, no tenderness at McBurney's point and negative Murphy's sign. No hernia.  Musculoskeletal: Normal range of motion. She exhibits no edema.  Neurological: She is alert and oriented to person, place, and time. Coordination normal.  Skin: Skin is warm and dry. Rash noted. Rash is maculopapular.     Psychiatric: She has a normal mood and affect. Her behavior is normal. Judgment and thought content normal.  Nursing note and vitals reviewed.      Patient has been counseled extensively about nutrition and exercise as well as the importance of adherence with medications and regular follow-up. The patient was given clear instructions to go to ER or return to medical center if symptoms don't improve, worsen or new problems develop. The patient verbalized understanding.   Follow-up: Return for NON FASTING LABS and physical; f/u UTI symptoms/skin rash.   Claiborne RiggZelda W Myasia Sinatra, FNP-BC [facility] [place], [place] [phone]   10/29/2017, 6:00 PM

## 2017-10-31 LAB — URINE CYTOLOGY ANCILLARY ONLY
CHLAMYDIA, DNA PROBE: NEGATIVE
Neisseria Gonorrhea: NEGATIVE
Trichomonas: NEGATIVE

## 2017-11-01 LAB — URINE CYTOLOGY ANCILLARY ONLY: CANDIDA VAGINITIS: NEGATIVE

## 2017-11-03 ENCOUNTER — Other Ambulatory Visit: Payer: Self-pay | Admitting: Nurse Practitioner

## 2017-11-03 MED ORDER — METRONIDAZOLE 0.75 % VA GEL: 1.0000 | Freq: Two times a day (BID) | VAGINAL | 0 refills | Status: AC

## 2017-11-05 ENCOUNTER — Telehealth: Payer: Self-pay

## 2017-11-05 NOTE — Telephone Encounter (Signed)
CMA spoke to patient to inform on lab results.  Patient verified DOB. Patient understood.  

## 2017-11-05 NOTE — Telephone Encounter (Signed)
-----   Message from Claiborne RiggZelda W Fleming, NP sent at 11/03/2017 12:49 PM EDT ----- Labs positive for bacterial vaginosis. Will send a prescription. As you had the same bacterial infection last month will send a vaginal medication for you to use.

## 2017-11-27 ENCOUNTER — Ambulatory Visit: Payer: Medicaid Other | Attending: Nurse Practitioner | Admitting: Nurse Practitioner

## 2017-11-27 ENCOUNTER — Encounter: Payer: Self-pay | Admitting: Nurse Practitioner

## 2017-11-27 VITALS — BP 96/65 | HR 71 | Temp 99.1°F | Ht 65.0 in | Wt 140.4 lb

## 2017-11-27 DIAGNOSIS — R102 Pelvic and perineal pain: Secondary | ICD-10-CM | POA: Insufficient documentation

## 2017-11-27 DIAGNOSIS — R946 Abnormal results of thyroid function studies: Secondary | ICD-10-CM | POA: Insufficient documentation

## 2017-11-27 DIAGNOSIS — R7989 Other specified abnormal findings of blood chemistry: Secondary | ICD-10-CM

## 2017-11-27 DIAGNOSIS — Z0000 Encounter for general adult medical examination without abnormal findings: Secondary | ICD-10-CM | POA: Insufficient documentation

## 2017-11-27 DIAGNOSIS — Z9889 Other specified postprocedural states: Secondary | ICD-10-CM | POA: Insufficient documentation

## 2017-11-27 DIAGNOSIS — R2242 Localized swelling, mass and lump, left lower limb: Secondary | ICD-10-CM | POA: Insufficient documentation

## 2017-11-27 NOTE — Progress Notes (Signed)
Assessment & Plan:  Laura Warren was seen today for annual exam.  Diagnoses and all orders for this visit:  Encounter for annual physical examination excluding gynecological examination in a patient older than 17 years F/U one year  Pelvic pain -     US Transvaginal Non-OB; Future -     US PELVIS (TRANSABDOMINAL ONLY); Future  Abnormal TSH -     Thyroid Panel With TSH  Mass of left thigh -     [place] LT LOWER EXTREM LTD SOFT TISSUE NON VASCULAR; Future    Patient has been counseled on age-appropriate routine health concerns for screening and prevention. These are reviewed and up-to-date. Referrals have been placed accordingly. Immunizations are up-to-date or declined.    Subjective:   Chief Complaint  Patient presents with  . Annual Exam    Pt. is here for physcial. Pt. stated she has been dealing with bad headaches for a week now.    HPI Laura Warren 37 y.o. female presents to office today for annual physical exam. She is not due for PAP until 2020.    Review of Systems  Constitutional: Negative.  Negative for chills, fever, malaise/fatigue and weight loss.  HENT: Negative.  Negative for congestion, hearing loss, nosebleeds, sinus pain and sore throat.   Eyes: Negative.  Negative for blurred vision, double vision, photophobia and pain.  Respiratory: Negative.  Negative for cough, sputum production, shortness of breath and wheezing.   Cardiovascular: Negative.  Negative for chest pain, palpitations and leg swelling.  Gastrointestinal: Negative.  Negative for abdominal pain, constipation, diarrhea, heartburn, nausea and vomiting.  Genitourinary:       Pelvic pain; ongoing for several months; neg chemistries  Musculoskeletal: Negative.  Negative for joint pain and myalgias.  Skin: Negative for rash.       mass  Neurological: Positive for headaches (tension/stress). Negative for dizziness, tremors, speech change, focal weakness and seizures.  Endo/Heme/Allergies: Negative.   Negative for environmental allergies.  Psychiatric/Behavioral: Negative.  Negative for depression and suicidal ideas. The patient is not nervous/anxious and does not have insomnia.     Past Medical History:  Diagnosis Date  . Medical history non-contributory     Past Surgical History:  Procedure Laterality Date  . APPENDECTOMY      Family History  Problem Relation Age of Onset  . Cancer Mother     Social History Reviewed with no changes to be made today.   Outpatient Medications Prior to Visit  Medication Sig Dispense Refill  . Triamcinolone Acetonide 0.025 % LOTN To both arms twice daily. 60 mL 1  . Vitamin D, Ergocalciferol, (DRISDOL) 50000 units CAPS capsule Take 1 capsule (50,000 Units total) by mouth every 7 (seven) days. 16 capsule 0  . promethazine (PHENERGAN) 12.5 MG tablet Take 1 tablet (12.5 mg total) by mouth every 6 (six) hours as needed for nausea or vomiting. (Patient not taking: Reported on 04/13/2016) 30 tablet 0   No facility-administered medications prior to visit.     No Known Allergies     Objective:    BP 96/65 (BP Location: Left Arm, Patient Position: Sitting, Cuff Size: Normal)   Pulse 71   Temp 99.1 F (37.3 C) (Oral)   Ht 5\' 5"  (1.651 m)   Wt 140 lb 6.4 oz (63.7 kg)   SpO2 97%   BMI 23.36 kg/m  Wt Readings from Last 3 Encounters:  11/27/17 140 lb 6.4 oz (63.7 kg)  10/29/17 141 lb 6.4 oz (64.1 kg)  10/03/17  143 lb (64.9 kg)    Physical Exam  Constitutional: She is oriented to person, place, and time. She appears well-developed and well-nourished.  HENT:  Head: Normocephalic and atraumatic.  Right Ear: External ear normal.  Left Ear: External ear normal.  Nose: Nose normal.  Mouth/Throat: Oropharynx is clear and moist. No oropharyngeal exudate.  Eyes: Pupils are equal, round, and reactive to light. Conjunctivae and EOM are normal. Right eye exhibits no discharge. No scleral icterus.  Neck: Normal range of motion. Neck supple. No  tracheal deviation present. No thyromegaly present.  Cardiovascular: Normal rate, regular rhythm, normal heart sounds and intact distal pulses. Exam reveals no friction rub.  No murmur heard. Pulmonary/Chest: Effort normal and breath sounds normal. No accessory muscle usage. No respiratory distress. She has no decreased breath sounds. She has no wheezes. She has no rhonchi. She has no rales. She exhibits no tenderness. Right breast exhibits no inverted nipple, no mass, no nipple discharge, no skin change and no tenderness. Left breast exhibits no inverted nipple, no mass, no nipple discharge, no skin change and no tenderness. Breasts are symmetrical.  Abdominal: Soft. Bowel sounds are normal. She exhibits no distension and no mass. There is no tenderness. There is no rebound and no guarding.  Musculoskeletal: Normal range of motion. She exhibits no edema, tenderness or deformity.  Lymphadenopathy:    She has no cervical adenopathy.  Neurological: She is alert and oriented to person, place, and time. She has normal strength and normal reflexes. No cranial nerve deficit or sensory deficit. She displays a negative Romberg sign. Coordination and gait normal.  Skin: Skin is warm, dry and intact. Capillary refill takes 2 to 3 seconds. No rash noted. No erythema.     Psychiatric: She has a normal mood and affect. Her speech is normal and behavior is normal. Judgment and thought content normal.       Patient has been counseled extensively about nutrition and exercise as well as the importance of adherence with medications and regular follow-up. The patient was given clear instructions to go to ER or return to medical center if symptoms don't improve, worsen or new problems develop. The patient verbalized understanding.   Follow-up: Return if symptoms worsen or fail to improve.   Claiborne RiggZelda W Kamsiyochukwu Buist, FNP-BC [facility] [place], [place] [phone]   11/27/2017, 4:05 PM

## 2017-11-28 LAB — THYROID PANEL WITH TSH
Free Thyroxine Index: 2.4 (ref 1.2–4.9)
T3 Uptake Ratio: 30 % (ref 24–39)
T4, Total: 7.9 ug/dL (ref 4.5–12.0)
TSH: 1.29 u[IU]/mL (ref 0.450–4.500)

## 2017-12-02 ENCOUNTER — Telehealth: Payer: Self-pay

## 2017-12-02 NOTE — Telephone Encounter (Signed)
-----   Message from Claiborne RiggZelda W Fleming, NP sent at 11/28/2017 10:27 PM EDT ----- Thyroid levels are normal.

## 2017-12-02 NOTE — Telephone Encounter (Signed)
CMA attempt to call patient to inform on lab results.  No answer and left a VM for patient to call back.  If patient call back, please inform:  Thyroid levels are normal.

## 2017-12-03 ENCOUNTER — Other Ambulatory Visit (HOSPITAL_COMMUNITY): Payer: Self-pay

## 2017-12-03 ENCOUNTER — Ambulatory Visit (HOSPITAL_COMMUNITY)
Admission: RE | Admit: 2017-12-03 | Discharge: 2017-12-03 | Disposition: A | Discharge status: Home / Self Care | Payer: Medicaid Other | Source: Ambulatory Visit | Attending: Nurse Practitioner | Admitting: Nurse Practitioner

## 2017-12-03 DIAGNOSIS — N83201 Unspecified ovarian cyst, right side: Secondary | ICD-10-CM | POA: Insufficient documentation

## 2017-12-03 DIAGNOSIS — R102 Pelvic and perineal pain: Secondary | ICD-10-CM | POA: Diagnosis not present

## 2017-12-03 DIAGNOSIS — N83291 Other ovarian cyst, right side: Secondary | ICD-10-CM | POA: Diagnosis not present

## 2017-12-05 ENCOUNTER — Telehealth: Payer: Self-pay

## 2017-12-05 NOTE — Telephone Encounter (Signed)
CMA spoke to patient to inform on ultrasound results.  Patient understood.

## 2017-12-05 NOTE — Telephone Encounter (Signed)
-----   Message from Claiborne RiggZelda W Fleming, NP sent at 12/04/2017  9:52 PM EDT ----- Ultrasound shows a small right ovarian cyst. There are no other abnormalities present which would be the cause of your pelvic pain. The ovarian cyst is small and does not usually cause any pain and may resolve on its own after a few menstrual cycles

## 2017-12-05 NOTE — Telephone Encounter (Signed)
Patient prior auth for US LT lower Extremity LTD soft tissue was denied.  Phyiscian can request a Futures traderpeer-to-peer  Contact #: [phone].

## 2017-12-06 ENCOUNTER — Other Ambulatory Visit: Payer: Self-pay | Admitting: Nurse Practitioner

## 2017-12-06 DIAGNOSIS — R2242 Localized swelling, mass and lump, left lower limb: Secondary | ICD-10-CM

## 2017-12-06 NOTE — Telephone Encounter (Signed)
CMA spoke to patient to inform she have an Xray ordered.  Patient understood and will get the imaging done.

## 2017-12-06 NOTE — Telephone Encounter (Signed)
Please let patient know an xray has been ordered instead

## 2018-02-06 ENCOUNTER — Other Ambulatory Visit (HOSPITAL_COMMUNITY)
Admission: RE | Admit: 2018-02-06 | Discharge: 2018-02-06 | Disposition: A | Discharge status: Home / Self Care | Payer: Medicaid Other | Source: Ambulatory Visit | Attending: Family Medicine | Admitting: Family Medicine

## 2018-02-06 ENCOUNTER — Other Ambulatory Visit: Payer: Self-pay

## 2018-02-06 ENCOUNTER — Ambulatory Visit: Payer: Medicaid Other | Attending: Family Medicine | Admitting: Physician Assistant

## 2018-02-06 VITALS — BP 109/75 | HR 66 | Temp 98.4°F | Resp 18 | Ht 65.0 in | Wt 132.6 lb

## 2018-02-06 DIAGNOSIS — R112 Nausea with vomiting, unspecified: Secondary | ICD-10-CM | POA: Diagnosis not present

## 2018-02-06 DIAGNOSIS — R634 Abnormal weight loss: Secondary | ICD-10-CM | POA: Diagnosis not present

## 2018-02-06 DIAGNOSIS — R079 Chest pain, unspecified: Secondary | ICD-10-CM

## 2018-02-06 DIAGNOSIS — Z6822 Body mass index (BMI) 22.0-22.9, adult: Secondary | ICD-10-CM | POA: Diagnosis not present

## 2018-02-06 DIAGNOSIS — N3000 Acute cystitis without hematuria: Secondary | ICD-10-CM

## 2018-02-06 DIAGNOSIS — L249 Irritant contact dermatitis, unspecified cause: Secondary | ICD-10-CM

## 2018-02-06 DIAGNOSIS — E569 Vitamin deficiency, unspecified: Secondary | ICD-10-CM | POA: Diagnosis not present

## 2018-02-06 LAB — POCT URINALYSIS DIP (CLINITEK)
BILIRUBIN UA: NEGATIVE
BILIRUBIN UA: NEGATIVE mg/dL
GLUCOSE UA: NEGATIVE mg/dL
Nitrite, UA: NEGATIVE
POC,PROTEIN,UA: NEGATIVE
SPEC GRAV UA: 1.01 (ref 1.010–1.025)
Urobilinogen, UA: 0.2 E.U./dL
pH, UA: 6 (ref 5.0–8.0)

## 2018-02-06 LAB — POCT URINE PREGNANCY: Preg Test, Ur: NEGATIVE

## 2018-02-06 MED ORDER — SULFAMETHOXAZOLE-TRIMETHOPRIM 800-160 MG PO TABS: 1.0000 | ORAL_TABLET | Freq: Two times a day (BID) | ORAL | 0 refills | Status: DC

## 2018-02-06 MED ORDER — TRIAMCINOLONE ACETONIDE 0.025 % EX LOTN: TOPICAL_LOTION | CUTANEOUS | 1 refills | Status: DC

## 2018-02-06 MED ORDER — PROMETHAZINE HCL 12.5 MG PO TABS: 12.5000 mg | ORAL_TABLET | Freq: Four times a day (QID) | ORAL | 0 refills | Status: DC | PRN

## 2018-02-06 MED ORDER — FLUCONAZOLE 150 MG PO TABS: 150.0000 mg | ORAL_TABLET | Freq: Once | ORAL | 0 refills | Status: AC

## 2018-02-06 NOTE — Progress Notes (Signed)
Flu shot: yes  Pain in chest causing sob yesterday 9  and upper back 2wks 8,    Patient stated that she threw up yesterday.

## 2018-02-06 NOTE — Progress Notes (Signed)
Patient ID: Laura Warren, female   DOB: 10-19-1980, 37 y.o.   MRN: [id number]     Laura Warren, is a 37 y.o. female  QIO:[id number]  LKG:[id number]  DOB - 1981-02-18  Subjective:  Chief Complaint and HPI: Laura Warren is a 37 y.o. female here today for Chest pain and vomited last night and vomited today.  +SOB and feels pressure on chest.  Slight nausea.  Non-smoker  Also concerned due to weight loss(11 pounds since May).  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  Appetite has been good until yesterday.    Needs to have vitamin D rechecked.    No FH early cardiac events.   ROS:   Constitutional:  No f/c, No night sweats, No unexplained weight loss. EENT:  No vision changes, No blurry vision, No hearing changes. No mouth, throat, or ear problems.  Respiratory: No cough, No SOB Cardiac: No CP, no palpitations GI:  + lower abd pain last night(not now), + N/V, no D. GU: No Urinary s/sx/ no vaginal discharge Musculoskeletal: No joint pain Neuro: No headache, no dizziness, no motor weakness.  Skin: No rash Endocrine:  No polydipsia. No polyuria.  Psych: Denies SI/HI  No problems updated.  ALLERGIES: No Known Allergies  PAST MEDICAL HISTORY: Past Medical History:  Diagnosis Date  . Medical history non-contributory     MEDICATIONS AT HOME: Prior to Admission medications   Medication Sig Start Date End Date Taking? Authorizing Provider  promethazine (PHENERGAN) 12.5 MG tablet Take 1 tablet (12.5 mg total) by mouth every 6 (six) hours as needed for nausea or vomiting. 11/14/15  Yes Judeth Horn, NP  Triamcinolone Acetonide 0.025 % LOTN To both arms twice daily. 02/06/18  Yes Anders Simmonds, PA-C  Vitamin D, Ergocalciferol, (DRISDOL) 50000 units CAPS capsule Take 1 capsule (50,000 Units total) by mouth every 7 (seven) days. 10/29/17  Yes Claiborne Rigg, NP     Objective:  EXAM:   Vitals:   02/06/18 1555 02/06/18 1558  BP: 109/75 109/75    Pulse: 66 66  Resp: 18   Temp: 98.4 F (36.9 C) 98.4 F (36.9 C)  TempSrc: Oral Oral  SpO2: 99%   Weight: 132 lb 9.6 oz (60.1 kg)   Height: 5\' 5"  (1.651 m)     General appearance : A&OX3. NAD. Non-toxic-appearing HEENT: Atraumatic and Normocephalic.  PERRLA. EOM intact.  TM clear B. Mouth-MMM, post pharynx WNL w/o erythema, No PND. Neck: supple, no JVD. No cervical lymphadenopathy. No thyromegaly Chest/Lungs:  Breathing-non-labored, Good air entry bilaterally, breath sounds normal without rales, rhonchi, or wheezing  CVS: S1 S2 regular, no murmurs, gallops, rubs  Abdomen: Bowel sounds present, Non tender and not distended with no gaurding, rigidity or rebound. Extremities: Bilateral Lower Ext shows no edema, both legs are warm to touch with = pulse throughout Neurology:  CN II-XII grossly intact, Non focal.   Psych:  TP linear. J/I WNL. Normal speech. Appropriate eye contact and affect.  Skin:  No Rash  Data Review No results found for: HGBA1C   Assessment & Plan   1. Chest pain, unspecified type - EKG 12-Lead-no EKG/ST changes  2. Nausea and vomiting, intractability of vomiting not specified, unspecified vomiting type Viral syndrome vs UTI - POCT urine pregnancy - POCT URINALYSIS DIP (CLINITEK) - Comprehensive metabolic panel -Rx phenergan 12.5 mg   3. Irritant contact dermatitis, unspecified trigger - Triamcinolone Acetonide 0.025 % LOTN; To both arms twice daily.  Dispense: 60 mL; Refill:  1  4. Weight loss Likely just related to cessation of nursing and getting back to pre-pregnancy weight/taking care of active 60 month old - TSH  5. Vitamin deficiency Will check level.  - Vitamin D, 25-hydroxy   Patient have been counseled extensively about nutrition and exercise  Return in about 3 months (around 05/09/2018) for Bertram Denver for vitamin d recheck.  The patient was given clear instructions to go to ER or return to medical center if symptoms don't improve,  worsen or new problems develop. The patient verbalized understanding. The patient was told to call to get lab results if they haven't heard anything in the next week.     Georgian Co, PA-C [facility] [place], [place] [phone]   02/06/2018, 4:21 PM

## 2018-02-07 LAB — COMPREHENSIVE METABOLIC PANEL
ALK PHOS: 34 IU/L — AB (ref 39–117)
ALT: 10 IU/L (ref 0–32)
AST: 17 IU/L (ref 0–40)
Albumin/Globulin Ratio: 1.7 (ref 1.2–2.2)
Albumin: 4.5 g/dL (ref 3.5–5.5)
BILIRUBIN TOTAL: 0.5 mg/dL (ref 0.0–1.2)
BUN/Creatinine Ratio: 13 (ref 9–23)
BUN: 10 mg/dL (ref 6–20)
CHLORIDE: 103 mmol/L (ref 96–106)
CO2: 23 mmol/L (ref 20–29)
Calcium: 9.5 mg/dL (ref 8.7–10.2)
Creatinine, Ser: 0.76 mg/dL (ref 0.57–1.00)
GFR calc non Af Amer: 101 mL/min/{1.73_m2} (ref >59)
GFR, EST AFRICAN AMERICAN: 116 mL/min/{1.73_m2} (ref >59)
Globulin, Total: 2.6 g/dL (ref 1.5–4.5)
Glucose: 73 mg/dL (ref 65–99)
POTASSIUM: 4 mmol/L (ref 3.5–5.2)
Sodium: 141 mmol/L (ref 134–144)
Total Protein: 7.1 g/dL (ref 6.0–8.5)

## 2018-02-07 LAB — VITAMIN D 25 HYDROXY (VIT D DEFICIENCY, FRACTURES): VIT D 25 HYDROXY: 51.5 ng/mL (ref 30.0–100.0)

## 2018-02-07 LAB — TSH: TSH: 1.49 u[IU]/mL (ref 0.450–4.500)

## 2018-02-08 LAB — URINE CULTURE

## 2018-02-10 LAB — URINE CYTOLOGY ANCILLARY ONLY
Chlamydia: NEGATIVE
NEISSERIA GONORRHEA: NEGATIVE
Trichomonas: NEGATIVE

## 2018-02-11 LAB — URINE CYTOLOGY ANCILLARY ONLY: CANDIDA VAGINITIS: NEGATIVE

## 2018-02-12 ENCOUNTER — Telehealth: Payer: Self-pay

## 2018-02-12 ENCOUNTER — Other Ambulatory Visit: Payer: Self-pay | Admitting: Physician Assistant

## 2018-02-12 MED ORDER — METRONIDAZOLE 500 MG PO TABS: 500.0000 mg | ORAL_TABLET | Freq: Three times a day (TID) | ORAL | 0 refills | Status: DC

## 2018-02-12 NOTE — Telephone Encounter (Signed)
-----   Message from Anders Simmonds, [place] sent at 02/12/2018  8:53 AM EDT ----- Please call patient.  She was positive for bacterial vaginitis, but no STD or yeast.  I sent her a prescription for this.  Her vitamin D level was normal.  She can now take over the counter Vitamin D 2000 units daily to keep levels normal.  All of her other labs were normal.  Follow-up as planned.  Thanks, Georgian Co, PA-C

## 2018-02-12 NOTE — Telephone Encounter (Signed)
Patient was called and verified DOB. Patient was informed of most recent note and verbalized understanding, no further questions.

## 2018-05-09 ENCOUNTER — Encounter: Payer: Self-pay | Admitting: Nurse Practitioner

## 2018-05-09 ENCOUNTER — Ambulatory Visit: Payer: Medicaid Other | Attending: Nurse Practitioner | Admitting: Nurse Practitioner

## 2018-05-09 VITALS — BP 103/70 | HR 75 | Temp 98.1°F | Ht 65.0 in | Wt 132.2 lb

## 2018-05-09 DIAGNOSIS — R399 Unspecified symptoms and signs involving the genitourinary system: Secondary | ICD-10-CM

## 2018-05-09 DIAGNOSIS — N390 Urinary tract infection, site not specified: Secondary | ICD-10-CM | POA: Insufficient documentation

## 2018-05-09 DIAGNOSIS — R102 Pelvic and perineal pain: Secondary | ICD-10-CM

## 2018-05-09 DIAGNOSIS — M1990 Unspecified osteoarthritis, unspecified site: Secondary | ICD-10-CM | POA: Insufficient documentation

## 2018-05-09 DIAGNOSIS — R42 Dizziness and giddiness: Secondary | ICD-10-CM | POA: Diagnosis not present

## 2018-05-09 DIAGNOSIS — Z809 Family history of malignant neoplasm, unspecified: Secondary | ICD-10-CM | POA: Diagnosis not present

## 2018-05-09 DIAGNOSIS — M2550 Pain in unspecified joint: Secondary | ICD-10-CM

## 2018-05-09 LAB — POCT URINALYSIS DIP (CLINITEK)
Bilirubin, UA: NEGATIVE
GLUCOSE UA: NEGATIVE mg/dL
Ketones, POC UA: NEGATIVE mg/dL
Nitrite, UA: NEGATIVE
POC PROTEIN,UA: 100 — AB
Spec Grav, UA: 1.015 (ref 1.010–1.025)
UROBILINOGEN UA: 0.2 U/dL
pH, UA: 7 (ref 5.0–8.0)

## 2018-05-09 NOTE — Progress Notes (Signed)
Patient is having joint pain and dizziness.

## 2018-05-09 NOTE — Progress Notes (Signed)
Assessment & Plan:  Laura Warren was seen today for arthritis and dizziness.  Diagnoses and all orders for this visit:  Dizziness -     CMP14+EGFR Orthostatics were negative. Symptoms may subside in conjunction with her cold symptoms. Will f/u in a few weeks.   Generalized joint pain -     VITAMIN D 25 Hydroxy (Vit-D Deficiency, Fractures) -     Rheumatoid factor -     C-reactive protein -     Sedimentation Rate  Urinary tract infection symptoms -     POCT URINALYSIS DIP (CLINITEK) -     Urine Culture    Patient has been counseled on age-appropriate routine health concerns for screening and prevention. These are reviewed and up-to-date. Referrals have been placed accordingly. Immunizations are up-to-date or declined.    Subjective:   Chief Complaint  Patient presents with  . Arthritis  . Dizziness   HPI Laura Warren 38 y.o. female presents to office today with complaints of dizziness and generalized joint pain.  Vertigo - Dizziness: Patient presents with dizziness . The dizziness has been present for 1 week with the onset of cold symptoms which she states the cold symptoms currently have resolved.  Last occurrence of dizziness was yesterday. The patient describes the symptoms as disequalibirum and lightheadedness. Symptoms are exacerbated by none identified The patient also complains of none. Patient denies none.  She has been treated with nothing.  Previous work up has been NONE.   Joint Pain: Patient complains of arthralgias and muscle weakness  which has been present for several weeks. Pain is located in multiple joints, is described as aching, and is intermittent .  Associated symptoms include: decreased range of motion.  The patient has tried nothing for pain relief.  Related to injury:   no.   Urinary Tract Infection: Patient complains of suprapubic pressure She has had symptoms for several days. Patient denies back pain, fever and vaginal discharge. Patient does not  have a history of recurrent UTI.  Patient does not have a history of pyelonephritis.    Review of Systems  Constitutional: Negative for fever, malaise/fatigue and weight loss.  HENT: Negative.  Negative for nosebleeds.   Eyes: Negative.  Negative for blurred vision, double vision and photophobia.  Respiratory: Negative.  Negative for cough and shortness of breath.   Cardiovascular: Negative.  Negative for chest pain, palpitations and leg swelling.  Gastrointestinal: Positive for abdominal pain. Negative for heartburn, nausea and vomiting.       SEE HPI  Genitourinary: Negative.   Musculoskeletal: Positive for joint pain and myalgias.  Neurological: Positive for dizziness. Negative for focal weakness, seizures and headaches.  Psychiatric/Behavioral: Negative.  Negative for suicidal ideas.    Past Medical History:  Diagnosis Date  . Medical history non-contributory     Past Surgical History:  Procedure Laterality Date  . APPENDECTOMY      Family History  Problem Relation Age of Onset  . Cancer Mother     Social History Reviewed with no changes to be made today.   Outpatient Medications Prior to Visit  Medication Sig Dispense Refill  . metroNIDAZOLE (FLAGYL) 500 MG tablet Take 1 tablet (500 mg total) by mouth 3 (three) times daily. (Patient not taking: Reported on 05/09/2018) 14 tablet 0  . promethazine (PHENERGAN) 12.5 MG tablet Take 1 tablet (12.5 mg total) by mouth every 6 (six) hours as needed for nausea or vomiting. (Patient not taking: Reported on 05/09/2018) 30 tablet 0  . sulfamethoxazole-trimethoprim (  BACTRIM DS,SEPTRA DS) 800-160 MG tablet Take 1 tablet by mouth 2 (two) times daily. (Patient not taking: Reported on 05/09/2018) 14 tablet 0  . Triamcinolone Acetonide 0.025 % LOTN To both arms twice daily. (Patient not taking: Reported on 05/09/2018) 60 mL 1  . Vitamin D, Ergocalciferol, (DRISDOL) 50000 units CAPS capsule Take 1 capsule (50,000 Units total) by mouth every 7  (seven) days. (Patient not taking: Reported on 05/09/2018) 16 capsule 0   No facility-administered medications prior to visit.     No Known Allergies     Objective:    BP 103/70   Pulse 75   Temp 98.1 F (36.7 C) (Oral)   Ht '5\' 5"'  (1.651 m)   Wt 132 lb 3.2 oz (60 kg)   SpO2 99%   BMI 22.00 kg/m  Wt Readings from Last 3 Encounters:  05/09/18 132 lb 3.2 oz (60 kg)  02/06/18 132 lb 9.6 oz (60.1 kg)  11/27/17 140 lb 6.4 oz (63.7 kg)   Depression screen [facility] 2/9 05/09/2018 02/06/2018 10/29/2017 10/03/2017 04/24/2017  Decreased Interest 0 0 0 0 0  Down, Depressed, Hopeless 0 0 0 0 0  PHQ - 2 Score 0 0 0 0 0  Altered sleeping 0 0 0 0 -  Tired, decreased energy 0 2 0 2 -  Change in appetite 0 0 0 1 -  Feeling bad or failure about yourself  0 0 0 0 -  Trouble concentrating 0 0 0 0 -  Moving slowly or fidgety/restless 0 0 0 0 -  Suicidal thoughts 0 0 0 0 -  PHQ-9 Score 0 2 0 3 -   Physical Exam Vitals signs and nursing note reviewed.  Constitutional:      Appearance: She is well-developed.  HENT:     Head: Normocephalic and atraumatic.     Right Ear: Hearing, tympanic membrane, ear canal and external ear normal.     Left Ear: Hearing, tympanic membrane, ear canal and external ear normal.     Nose: Nose normal.     Right Turbinates: Not enlarged or swollen.     Left Turbinates: Not enlarged or swollen.     Right Sinus: No maxillary sinus tenderness or frontal sinus tenderness.     Left Sinus: No maxillary sinus tenderness or frontal sinus tenderness.  Neck:     Musculoskeletal: Normal range of motion.  Cardiovascular:     Rate and Rhythm: Normal rate and regular rhythm.     Heart sounds: Normal heart sounds. No murmur. No friction rub. No gallop.   Pulmonary:     Effort: Pulmonary effort is normal. No tachypnea or respiratory distress.     Breath sounds: Normal breath sounds. No decreased breath sounds, wheezing, rhonchi or rales.  Chest:     Chest wall: No tenderness.    Abdominal:     General: Bowel sounds are normal.     Palpations: Abdomen is soft.     Tenderness: There is abdominal tenderness in the suprapubic area. There is no right CVA tenderness, left CVA tenderness, guarding or rebound.  Musculoskeletal: Normal range of motion.  Skin:    General: Skin is warm and dry.  Neurological:     Mental Status: She is alert and oriented to person, place, and time.     Cranial Nerves: Cranial nerves are intact.     Sensory: Sensation is intact.     Motor: Motor function is intact.     Coordination: Coordination is intact. Romberg sign  negative. Coordination normal.     Gait: Gait is intact.  Psychiatric:        Behavior: Behavior normal. Behavior is cooperative.        Thought Content: Thought content normal.        Judgment: Judgment normal.          Patient has been counseled extensively about nutrition and exercise as well as the importance of adherence with medications and regular follow-up. The patient was given clear instructions to go to ER or return to medical center if symptoms don't improve, worsen or new problems develop. The patient verbalized understanding.   Follow-up: Return in about 3 weeks (around 05/30/2018) for dizziness.   Gildardo Pounds, FNP-BC [facility] and [facility] [place], [place]   05/09/2018, 11:52 AM

## 2018-05-10 LAB — CMP14+EGFR
A/G RATIO: 1.9 (ref 1.2–2.2)
ALBUMIN: 4.3 g/dL (ref 3.5–5.5)
ALK PHOS: 31 IU/L — AB (ref 39–117)
ALT: 12 IU/L (ref 0–32)
AST: 15 IU/L (ref 0–40)
BILIRUBIN TOTAL: 1 mg/dL (ref 0.0–1.2)
BUN / CREAT RATIO: 15 (ref 9–23)
BUN: 13 mg/dL (ref 6–20)
CALCIUM: 9.1 mg/dL (ref 8.7–10.2)
CO2: 21 mmol/L (ref 20–29)
Chloride: 102 mmol/L (ref 96–106)
Creatinine, Ser: 0.85 mg/dL (ref 0.57–1.00)
GFR calc non Af Amer: 88 mL/min/{1.73_m2} (ref >59)
GFR, EST AFRICAN AMERICAN: 101 mL/min/{1.73_m2} (ref >59)
GLOBULIN, TOTAL: 2.3 g/dL (ref 1.5–4.5)
GLUCOSE: 71 mg/dL (ref 65–99)
POTASSIUM: 3.9 mmol/L (ref 3.5–5.2)
Sodium: 139 mmol/L (ref 134–144)
Total Protein: 6.6 g/dL (ref 6.0–8.5)

## 2018-05-10 LAB — C-REACTIVE PROTEIN: CRP: <1 mg/L (ref 0–10)

## 2018-05-10 LAB — VITAMIN D 25 HYDROXY (VIT D DEFICIENCY, FRACTURES): VIT D 25 HYDROXY: 24.3 ng/mL — AB (ref 30.0–100.0)

## 2018-05-10 LAB — RHEUMATOID FACTOR

## 2018-05-10 LAB — SEDIMENTATION RATE: Sed Rate: 5 mm/hr (ref 0–32)

## 2018-05-11 LAB — URINE CULTURE

## 2018-06-09 ENCOUNTER — Ambulatory Visit: Payer: Self-pay | Admitting: Nurse Practitioner

## 2018-06-10 ENCOUNTER — Telehealth: Payer: Self-pay | Admitting: *Deleted

## 2018-06-10 NOTE — Telephone Encounter (Signed)
Patient no-showed today's appointment; appointment was for 06/09/2018, patient agrees to reschedule missed appointment. Appointment scheduled for July 08, 2018 at 4:00 pm. Pt verbalized understanding.

## 2018-06-17 ENCOUNTER — Other Ambulatory Visit: Payer: Self-pay

## 2018-06-17 ENCOUNTER — Emergency Department (HOSPITAL_BASED_OUTPATIENT_CLINIC_OR_DEPARTMENT_OTHER)
Admission: EM | Admit: 2018-06-17 | Discharge: 2018-06-18 | Disposition: A | Discharge status: Home / Self Care | Payer: Medicaid Other | Attending: Emergency Medicine | Admitting: Emergency Medicine

## 2018-06-17 ENCOUNTER — Encounter (HOSPITAL_BASED_OUTPATIENT_CLINIC_OR_DEPARTMENT_OTHER): Payer: Self-pay

## 2018-06-17 DIAGNOSIS — R1084 Generalized abdominal pain: Secondary | ICD-10-CM | POA: Diagnosis not present

## 2018-06-17 DIAGNOSIS — R112 Nausea with vomiting, unspecified: Secondary | ICD-10-CM | POA: Diagnosis not present

## 2018-06-17 DIAGNOSIS — R197 Diarrhea, unspecified: Secondary | ICD-10-CM | POA: Insufficient documentation

## 2018-06-17 DIAGNOSIS — R52 Pain, unspecified: Secondary | ICD-10-CM | POA: Diagnosis not present

## 2018-06-17 DIAGNOSIS — Z209 Contact with and (suspected) exposure to unspecified communicable disease: Secondary | ICD-10-CM | POA: Insufficient documentation

## 2018-06-17 DIAGNOSIS — R1111 Vomiting without nausea: Secondary | ICD-10-CM | POA: Diagnosis not present

## 2018-06-17 LAB — URINALYSIS, ROUTINE W REFLEX MICROSCOPIC
Bilirubin Urine: NEGATIVE
Glucose, UA: NEGATIVE mg/dL
Ketones, ur: NEGATIVE mg/dL
NITRITE: NEGATIVE
Protein, ur: NEGATIVE mg/dL
Specific Gravity, Urine: <1.005 — ABNORMAL LOW (ref 1.005–1.030)
pH: 6 (ref 5.0–8.0)

## 2018-06-17 LAB — COMPREHENSIVE METABOLIC PANEL
ALK PHOS: 30 U/L — AB (ref 38–126)
ALT: 13 U/L (ref 0–44)
AST: 15 U/L (ref 15–41)
Albumin: 4.1 g/dL (ref 3.5–5.0)
Anion gap: 7 (ref 5–15)
BUN: 16 mg/dL (ref 6–20)
CO2: 23 mmol/L (ref 22–32)
Calcium: 8.7 mg/dL — ABNORMAL LOW (ref 8.9–10.3)
Chloride: 102 mmol/L (ref 98–111)
Creatinine, Ser: 0.7 mg/dL (ref 0.44–1.00)
GFR calc Af Amer: >60 mL/min (ref >60)
GFR calc non Af Amer: >60 mL/min (ref >60)
Glucose, Bld: 120 mg/dL — ABNORMAL HIGH (ref 70–99)
Potassium: 3.4 mmol/L — ABNORMAL LOW (ref 3.5–5.1)
Sodium: 132 mmol/L — ABNORMAL LOW (ref 135–145)
Total Bilirubin: 0.4 mg/dL (ref 0.3–1.2)
Total Protein: 6.8 g/dL (ref 6.5–8.1)

## 2018-06-17 LAB — PREGNANCY, URINE: Preg Test, Ur: NEGATIVE

## 2018-06-17 LAB — CBC
HCT: 40.3 % (ref 36.0–46.0)
Hemoglobin: 13 g/dL (ref 12.0–15.0)
MCH: 28.4 pg (ref 26.0–34.0)
MCHC: 32.3 g/dL (ref 30.0–36.0)
MCV: 88 fL (ref 80.0–100.0)
Platelets: 179 10*3/uL (ref 150–400)
RBC: 4.58 MIL/uL (ref 3.87–5.11)
RDW: 11.9 % (ref 11.5–15.5)
WBC: 6.9 10*3/uL (ref 4.0–10.5)
nRBC: 0 % (ref 0.0–0.2)

## 2018-06-17 LAB — LIPASE, BLOOD: Lipase: 54 U/L — ABNORMAL HIGH (ref 11–51)

## 2018-06-17 LAB — URINALYSIS, MICROSCOPIC (REFLEX): WBC, UA: NONE SEEN WBC/hpf (ref 0–5)

## 2018-06-17 MED ORDER — KETOROLAC TROMETHAMINE 30 MG/ML IJ SOLN
15.0000 mg | Freq: Once | INTRAMUSCULAR | Status: AC
  Administered 2018-06-17: 15 mg via INTRAVENOUS
  Filled 2018-06-17: qty 1

## 2018-06-17 MED ORDER — SODIUM CHLORIDE 0.9% FLUSH
3.0000 mL | Freq: Once | INTRAVENOUS | Status: AC
  Administered 2018-06-17: 3 mL via INTRAVENOUS
  Filled 2018-06-17: qty 3

## 2018-06-17 MED ORDER — ONDANSETRON HCL 4 MG/2ML IJ SOLN
4.0000 mg | Freq: Once | INTRAMUSCULAR | Status: AC | PRN
  Administered 2018-06-17: 4 mg via INTRAVENOUS
  Filled 2018-06-17: qty 2

## 2018-06-17 NOTE — ED Triage Notes (Signed)
Pt brought in by EMS from home with c/o lower abd pain with nausea and vomiting  Vomiting started about an hour ago  Pt's son is sick with a viral illness

## 2018-06-17 NOTE — ED Triage Notes (Signed)
Pt c/o periumbilical abdominal pain since 3pm with nausea/vomiting x1 hour. States she feels like she needs to have a bowel movement but cannot.

## 2018-06-18 ENCOUNTER — Encounter (HOSPITAL_BASED_OUTPATIENT_CLINIC_OR_DEPARTMENT_OTHER): Payer: Self-pay | Admitting: Emergency Medicine

## 2018-06-18 MED ORDER — ONDANSETRON 8 MG PO TBDP: ORAL_TABLET | ORAL | 0 refills | Status: DC

## 2018-06-18 NOTE — ED Provider Notes (Signed)
[facility] EMERGENCY DEPARTMENT Provider Note   CSN: [id number] Arrival date & time: 06/17/18  2155     History   Chief Complaint Chief Complaint  Patient presents with  . Abdominal Pain  . Emesis    HPI Laura Warren is a 38 y.o. female.  The history is provided by the patient.  Abdominal Pain  Pain location:  Generalized Pain quality: cramping   Pain radiates to:  Does not radiate Pain severity:  Moderate Onset quality:  Gradual Timing:  Constant Progression:  Unchanged Chronicity:  New Context: not recent travel, not retching and not trauma   Relieved by:  Nothing Worsened by:  Nothing Ineffective treatments:  None tried Associated symptoms: diarrhea and vomiting   Associated symptoms: no chest pain, no constipation, no fever, no hematemesis, no hematochezia, no shortness of breath and no vaginal discharge   Risk factors comment:  Child with same Emesis  Severity:  Moderate Timing:  Sporadic Quality:  Stomach contents Progression:  Resolved Chronicity:  New Recent urination:  Normal Relieved by:  Nothing Worsened by:  Nothing Associated symptoms: abdominal pain and diarrhea   Associated symptoms: no fever   Risk factors: sick contacts     Past Medical History:  Diagnosis Date  . Medical history non-contributory     Patient Active Problem List   Diagnosis Date Noted  . Uterine contractions during pregnancy 06/23/2016  . Currently pregnant 11/14/2015  . Lumbar back pain 09/03/2014    Past Surgical History:  Procedure Laterality Date  . APPENDECTOMY       OB History    Gravida  2   Para  2   Term  2   Preterm  0   AB  0   Living  2     SAB  0   TAB  0   Ectopic  0   Multiple  0   Live Births  2            Home Medications    Prior to Admission medications   Medication Sig Start Date End Date Taking? Authorizing Provider  metroNIDAZOLE (FLAGYL) 500 MG tablet Take 1 tablet (500 mg total) by mouth 3  (three) times daily. Patient not taking: Reported on 05/09/2018 02/12/18   Anders SimmondsMcClung, Angela M, PA-C  promethazine (PHENERGAN) 12.5 MG tablet Take 1 tablet (12.5 mg total) by mouth every 6 (six) hours as needed for nausea or vomiting. Patient not taking: Reported on 05/09/2018 02/06/18   Anders SimmondsMcClung, Angela M, PA-C  sulfamethoxazole-trimethoprim (BACTRIM DS,SEPTRA DS) 800-160 MG tablet Take 1 tablet by mouth 2 (two) times daily. Patient not taking: Reported on 05/09/2018 02/06/18   Anders SimmondsMcClung, Angela M, PA-C  Triamcinolone Acetonide 0.025 % LOTN To both arms twice daily. Patient not taking: Reported on 05/09/2018 02/06/18   Anders SimmondsMcClung, Angela M, PA-C  Vitamin D, Ergocalciferol, (DRISDOL) 50000 units CAPS capsule Take 1 capsule (50,000 Units total) by mouth every 7 (seven) days. Patient not taking: Reported on 05/09/2018 10/29/17   Claiborne RiggFleming, Zelda W, NP    Family History Family History  Problem Relation Age of Onset  . Cancer Mother     Social History Social History   Tobacco Use  . Smoking status: Never Smoker  . Smokeless tobacco: Never Used  Substance Use Topics  . Alcohol use: No  . Drug use: Never     Allergies   Patient has no known allergies.   Review of Systems Review of Systems  Constitutional: Negative for fever.  Respiratory: Negative for chest tightness and shortness of breath.   Cardiovascular: Negative for chest pain.  Gastrointestinal: Positive for abdominal pain, diarrhea and vomiting. Negative for constipation, hematemesis and hematochezia.  Genitourinary: Negative for vaginal discharge.  All other systems reviewed and are negative.    Physical Exam Updated Vital Signs BP 103/60 (BP Location: Left Arm)   Pulse 67   Temp 97.9 F (36.6 C) (Oral)   Resp 16   Ht 5\' 5"  (1.651 m)   Wt 59 kg   LMP 06/14/2018 (Exact Date)   SpO2 100%   Breastfeeding No   BMI 21.63 kg/m   Physical Exam Vitals signs and nursing note reviewed.  Constitutional:      General: She is not in acute  distress.    Appearance: Normal appearance. She is not toxic-appearing.  HENT:     Head: Normocephalic and atraumatic.     Nose: Nose normal.     Mouth/Throat:     Mouth: Mucous membranes are moist.     Pharynx: Oropharynx is clear.  Eyes:     Conjunctiva/sclera: Conjunctivae normal.     Pupils: Pupils are equal, round, and reactive to light.  Neck:     Musculoskeletal: Normal range of motion and neck supple.  Cardiovascular:     Rate and Rhythm: Normal rate and regular rhythm.     Pulses: Normal pulses.     Heart sounds: Normal heart sounds.  Pulmonary:     Effort: Pulmonary effort is normal.     Breath sounds: Normal breath sounds.  Abdominal:     General: Abdomen is flat. Bowel sounds are normal.     Tenderness: There is no abdominal tenderness. There is no guarding or rebound.  Musculoskeletal: Normal range of motion.  Skin:    General: Skin is warm and dry.     Capillary Refill: Capillary refill takes less than 2 seconds.  Neurological:     General: No focal deficit present.     Mental Status: She is alert and oriented to person, place, and time.  Psychiatric:        Mood and Affect: Mood normal.        Behavior: Behavior normal.      ED Treatments / Results  Labs (all labs ordered are listed, but only abnormal results are displayed) Results for orders placed or performed during the hospital encounter of 06/17/18  Lipase, blood  Result Value Ref Range   Lipase 54 (H) 11 - 51 U/L  Comprehensive metabolic panel  Result Value Ref Range   Sodium 132 (L) 135 - 145 mmol/L   Potassium 3.4 (L) 3.5 - 5.1 mmol/L   Chloride 102 98 - 111 mmol/L   CO2 23 22 - 32 mmol/L   Glucose, Bld 120 (H) 70 - 99 mg/dL   BUN 16 6 - 20 mg/dL   Creatinine, Ser 1.61 0.44 - 1.00 mg/dL   Calcium 8.7 (L) 8.9 - 10.3 mg/dL   Total Protein 6.8 6.5 - 8.1 g/dL   Albumin 4.1 3.5 - 5.0 g/dL   AST 15 15 - 41 U/L   ALT 13 0 - 44 U/L   Alkaline Phosphatase 30 (L) 38 - 126 U/L   Total Bilirubin  0.4 0.3 - 1.2 mg/dL   GFR calc non Af Amer >60 >60 mL/min   GFR calc Af Amer >60 >60 mL/min   Anion gap 7 5 - 15  CBC  Result Value Ref Range   WBC 6.9 4.0 -  10.5 K/uL   RBC 4.58 3.87 - 5.11 MIL/uL   Hemoglobin 13.0 12.0 - 15.0 g/dL   HCT 51.7 00.1 - 74.9 %   MCV 88.0 80.0 - 100.0 fL   MCH 28.4 26.0 - 34.0 pg   MCHC 32.3 30.0 - 36.0 g/dL   RDW 44.9 67.5 - 91.6 %   Platelets 179 150 - 400 K/uL   nRBC 0.0 0.0 - 0.2 %  Urinalysis, Routine w reflex microscopic  Result Value Ref Range   Color, Urine YELLOW YELLOW   APPearance CLEAR CLEAR   Specific Gravity, Urine <1.005 (L) 1.005 - 1.030   pH 6.0 5.0 - 8.0   Glucose, UA NEGATIVE NEGATIVE mg/dL   Hgb urine dipstick LARGE (A) NEGATIVE   Bilirubin Urine NEGATIVE NEGATIVE   Ketones, ur NEGATIVE NEGATIVE mg/dL   Protein, ur NEGATIVE NEGATIVE mg/dL   Nitrite NEGATIVE NEGATIVE   Leukocytes,Ua TRACE (A) NEGATIVE  Pregnancy, urine  Result Value Ref Range   Preg Test, Ur NEGATIVE NEGATIVE  Urinalysis, Microscopic (reflex)  Result Value Ref Range   RBC / HPF 0-5 0 - 5 RBC/hpf   WBC, UA NONE SEEN 0 - 5 WBC/hpf   Bacteria, UA RARE (A) NONE SEEN   Squamous Epithelial / LPF 0-5 0 - 5   No results found.  EKG None  Radiology No results found.  Procedures Procedures (including critical care time)  Medications Ordered in ED Medications  sodium chloride flush (NS) 0.9 % injection 3 mL (3 mLs Intravenous Given 06/17/18 2229)  ondansetron (ZOFRAN) injection 4 mg (4 mg Intravenous Given 06/17/18 2227)  ketorolac (TORADOL) 30 MG/ML injection 15 mg (15 mg Intravenous Given 06/17/18 2357)     Well appearing exam and vitals are benign and reassuring child with same.  Symptoms likely viral.  No indication for imaging at this time.  PO challenged in the ED>    Final Clinical Impressions(s) / ED Diagnoses   Return for pain, intractable cough, fevers >100.4 unrelieved by medication, shortness of breath, intractable vomiting, chest pain,  shortness of breath, weakness numbness, changes in speech, facial asymmetry,abdominal pain, passing out,Inability to tolerate liquids or food, cough, altered mental status or any concerns. No signs of systemic illness or infection. The patient is nontoxic-appearing on exam and vital signs are within normal limits.   I have reviewed the triage vital signs and the nursing notes. Pertinent labs &imaging results that were available during my care of the patient were reviewed by me and considered in my medical decision making (see chart for details).  After history, exam, and medical workup I feel the patient has been appropriately medically screened and is safe for discharge home. Pertinent diagnoses were discussed with the patient. Patient was given return precautions.   Carleen Rhue, MD 06/18/18 [id number]

## 2018-06-26 NOTE — Telephone Encounter (Signed)
Note created in error.

## 2018-07-08 ENCOUNTER — Ambulatory Visit: Payer: Medicaid Other | Attending: Nurse Practitioner | Admitting: Nurse Practitioner

## 2018-07-08 ENCOUNTER — Encounter: Payer: Self-pay | Admitting: Nurse Practitioner

## 2018-07-08 ENCOUNTER — Other Ambulatory Visit: Payer: Self-pay

## 2018-07-08 VITALS — BP 99/66 | HR 76 | Temp 98.8°F | Ht 65.0 in | Wt 128.8 lb

## 2018-07-08 DIAGNOSIS — R05 Cough: Secondary | ICD-10-CM | POA: Diagnosis not present

## 2018-07-08 DIAGNOSIS — Z809 Family history of malignant neoplasm, unspecified: Secondary | ICD-10-CM | POA: Insufficient documentation

## 2018-07-08 DIAGNOSIS — R072 Precordial pain: Secondary | ICD-10-CM | POA: Insufficient documentation

## 2018-07-08 DIAGNOSIS — Z79899 Other long term (current) drug therapy: Secondary | ICD-10-CM | POA: Diagnosis not present

## 2018-07-08 DIAGNOSIS — L249 Irritant contact dermatitis, unspecified cause: Secondary | ICD-10-CM

## 2018-07-08 DIAGNOSIS — R053 Chronic cough: Secondary | ICD-10-CM

## 2018-07-08 DIAGNOSIS — E559 Vitamin D deficiency, unspecified: Secondary | ICD-10-CM | POA: Insufficient documentation

## 2018-07-08 MED ORDER — TRIAMCINOLONE ACETONIDE 0.025 % EX LOTN: TOPICAL_LOTION | CUTANEOUS | 1 refills | Status: DC

## 2018-07-08 MED ORDER — BENZONATATE 100 MG PO CAPS: 200.0000 mg | ORAL_CAPSULE | Freq: Three times a day (TID) | ORAL | 0 refills | Status: DC | PRN

## 2018-07-08 MED ORDER — VITAMIN D (ERGOCALCIFEROL) 1.25 MG (50000 UNIT) PO CAPS: 50000.0000 [IU] | ORAL_CAPSULE | ORAL | 0 refills | Status: DC

## 2018-07-08 NOTE — Progress Notes (Signed)
Assessment & Plan:  Laura Warren was seen today for follow-up.  Diagnoses and all orders for this visit:  Precordial pain EKG (NORMAL)  Persistent dry cough -     benzonatate (TESSALON) 100 MG capsule; Take 2 capsules (200 mg total) by mouth 3 (three) times daily as needed for cough.  Vitamin D deficiency -     Vitamin D, Ergocalciferol, (DRISDOL) 1.25 MG (50000 UT) CAPS capsule; Take 1 capsule (50,000 Units total) by mouth every 7 (seven) days.  Irritant contact dermatitis, unspecified trigger -     Triamcinolone Acetonide 0.025 % LOTN; To both arms twice daily.    Patient has been counseled on age-appropriate routine health concerns for screening and prevention. These are reviewed and up-to-date. Referrals have been placed accordingly. Immunizations are up-to-date or declined.    Subjective:   Chief Complaint  Patient presents with  . Follow-up    Pt. stated she no longer feel dizzy, she had a fever, also chest pain.    HPI Laura Warren 38 y.o. female presents to office today for hospital follow up. Now with dry cough and chest pain. Chest pain is worsened with cough which is worse at night. She has not taken any OTC cough medications and states she does not like to take medications. Has been making a "remedy" at home. Onset of symptoms 8 days ago. She has not had any recent out of the country travel. Endorses fevers at home for 2 days last week but currently afebrile.   URI Treated for viral URI in the ED on 06-17-2018. Initially with complaints of nausea and vomiting as well as dizziness. Treated with zofran and toradol. She was able to tolerate POs and was discharged home.   Review of Systems  Constitutional: Positive for fever. Negative for malaise/fatigue and weight loss.  HENT: Negative.  Negative for nosebleeds.   Eyes: Negative.  Negative for blurred vision, double vision and photophobia.  Respiratory: Positive for cough (dry and non productive). Negative for  shortness of breath and wheezing.   Cardiovascular: Positive for chest pain. Negative for palpitations and leg swelling.  Gastrointestinal: Negative.  Negative for heartburn, nausea and vomiting.  Musculoskeletal: Negative.  Negative for myalgias.  Skin: Positive for itching and rash (left arm; recurrent).  Neurological: Negative.  Negative for dizziness, focal weakness, seizures and headaches.  Psychiatric/Behavioral: Negative.  Negative for suicidal ideas.    Past Medical History:  Diagnosis Date  . Medical history non-contributory     Past Surgical History:  Procedure Laterality Date  . APPENDECTOMY      Family History  Problem Relation Age of Onset  . Cancer Mother     Social History Reviewed with no changes to be made today.   Outpatient Medications Prior to Visit  Medication Sig Dispense Refill  . Vitamin D, Ergocalciferol, (DRISDOL) 50000 units CAPS capsule Take 1 capsule (50,000 Units total) by mouth every 7 (seven) days. 16 capsule 0  . ondansetron (ZOFRAN ODT) 8 MG disintegrating tablet 8mg  ODT q8hours prn nausea 4 tablet 0  . metroNIDAZOLE (FLAGYL) 500 MG tablet Take 1 tablet (500 mg total) by mouth 3 (three) times daily. (Patient not taking: Reported on 05/09/2018) 14 tablet 0  . promethazine (PHENERGAN) 12.5 MG tablet Take 1 tablet (12.5 mg total) by mouth every 6 (six) hours as needed for nausea or vomiting. (Patient not taking: Reported on 05/09/2018) 30 tablet 0  . sulfamethoxazole-trimethoprim (BACTRIM DS,SEPTRA DS) 800-160 MG tablet Take 1 tablet by mouth 2 (two) times  daily. (Patient not taking: Reported on 05/09/2018) 14 tablet 0  . Triamcinolone Acetonide 0.025 % LOTN To both arms twice daily. (Patient not taking: Reported on 05/09/2018) 60 mL 1   No facility-administered medications prior to visit.     No Known Allergies     Objective:    BP 99/66 (BP Location: Right Arm, Patient Position: Sitting, Cuff Size: Normal)   Pulse 76   Temp 98.8 F (37.1 C)  (Oral)   Ht 5\' 5"  (1.651 m)   Wt 128 lb 12.8 oz (58.4 kg)   LMP 06/14/2018 (Exact Date)   SpO2 98%   BMI 21.43 kg/m  Wt Readings from Last 3 Encounters:  07/08/18 128 lb 12.8 oz (58.4 kg)  06/17/18 130 lb (59 kg)  05/09/18 132 lb 3.2 oz (60 kg)    Physical Exam Vitals signs and nursing note reviewed.  Constitutional:      Appearance: She is well-developed.  HENT:     Head: Normocephalic and atraumatic.     Right Ear: Hearing, tympanic membrane, ear canal and external ear normal.     Left Ear: Hearing, tympanic membrane, ear canal and external ear normal.     Nose:     Right Turbinates: Pale. Not enlarged or swollen.     Left Turbinates: Pale. Not enlarged or swollen.     Right Sinus: No maxillary sinus tenderness or frontal sinus tenderness.     Left Sinus: No maxillary sinus tenderness or frontal sinus tenderness.     Mouth/Throat:     Pharynx: No pharyngeal swelling, oropharyngeal exudate, posterior oropharyngeal erythema or uvula swelling.     Tonsils: No tonsillar exudate or tonsillar abscesses.  Neck:     Musculoskeletal: Normal range of motion.  Cardiovascular:     Rate and Rhythm: Normal rate and regular rhythm.     Heart sounds: Normal heart sounds. No murmur. No friction rub. No gallop.   Pulmonary:     Effort: Pulmonary effort is normal. No tachypnea or respiratory distress.     Breath sounds: Normal breath sounds. No decreased breath sounds, wheezing, rhonchi or rales.  Chest:     Chest wall: Tenderness present.    Abdominal:     General: Bowel sounds are normal.     Palpations: Abdomen is soft.  Musculoskeletal: Normal range of motion.  Skin:    General: Skin is warm and dry.     Findings: Erythema and rash present. Rash is macular.       Neurological:     Mental Status: She is alert and oriented to person, place, and time.     Coordination: Coordination normal.  Psychiatric:        Behavior: Behavior normal. Behavior is cooperative.        Thought  Content: Thought content normal.        Judgment: Judgment normal.          Patient has been counseled extensively about nutrition and exercise as well as the importance of adherence with medications and regular follow-up. The patient was given clear instructions to go to ER or return to medical center if symptoms don't improve, worsen or new problems develop. The patient verbalized understanding.   Follow-up: Return in about 2 months (around 09/07/2018) for vitamin d lab visit.   Claiborne Rigg, FNP-BC [facility] [place], [place] [phone]   07/08/2018, 4:39 PM

## 2018-07-08 NOTE — Patient Instructions (Signed)
INSTRUCTIONS: use a humidifier for nasal congestion Drink plenty of fluids, rest and wash hands frequently to avoid the spread of infection Alternate tylenol and Motrin for relief of fever  Cough, Adult  Coughing is a reflex that clears your throat and your airways. Coughing helps to heal and protect your lungs. It is normal to cough occasionally, but a cough that happens with other symptoms or lasts a long time may be a sign of a condition that needs treatment. A cough may last only 2-3 weeks (acute), or it may last longer than 8 weeks (chronic). What are the causes? Coughing is commonly caused by:  Breathing in substances that irritate your lungs.  A viral or bacterial respiratory infection.  Allergies.  Asthma.  Postnasal drip.  Smoking.  Acid backing up from the stomach into the esophagus (gastroesophageal reflux).  Certain medicines.  Chronic lung problems, including COPD (or rarely, lung cancer).  Other medical conditions such as heart failure. Follow these instructions at home: Pay attention to any changes in your symptoms. Take these actions to help with your discomfort:  Take medicines only as told by your health care provider. ? If you were prescribed an antibiotic medicine, take it as told by your health care provider. Do not stop taking the antibiotic even if you start to feel better. ? Talk with your health care provider before you take a cough suppressant medicine.  Drink enough fluid to keep your urine clear or pale yellow.  If the air is dry, use a cold steam vaporizer or humidifier in your bedroom or your home to help loosen secretions.  Avoid anything that causes you to cough at work or at home.  If your cough is worse at night, try sleeping in a semi-upright position.  Avoid cigarette smoke. If you smoke, quit smoking. If you need help quitting, ask your health care provider.  Avoid caffeine.  Avoid alcohol.  Rest as needed. Contact a health care  provider if:  You have new symptoms.  You cough up pus.  Your cough does not get better after 2-3 weeks, or your cough gets worse.  You cannot control your cough with suppressant medicines and you are losing sleep.  You develop pain that is getting worse or pain that is not controlled with pain medicines.  You have a fever.  You have unexplained weight loss.  You have night sweats. Get help right away if:  You cough up blood.  You have difficulty breathing.  Your heartbeat is very fast. This information is not intended to replace advice given to you by your health care provider. Make sure you discuss any questions you have with your health care provider. Document Released: 10/20/2010 Document Revised: 09/29/2015 Document Reviewed: 06/30/2014 Elsevier Interactive Patient Education  2019 [employer]. [employer] Vaporizer A cool mist vaporizer is a device that releases a cool mist into the air. If you have a cough or a cold, using a vaporizer may help relieve your symptoms. The mist adds moisture to the air, which may help thin your mucus and make it less sticky. When your mucus is thin and less sticky, it easier for you to breathe and to cough up secretions. Do not use a vaporizer if you are allergic to mold. Follow these instructions at home:  Follow the instructions that come with the vaporizer.  Do not use anything other than distilled water in the vaporizer.  Do not run the vaporizer all of the time. Doing that can cause  mold or bacteria to grow in the vaporizer.  Clean the vaporizer after each time that you use it.  Clean and dry the vaporizer well before storing it.  Stop using the vaporizer if your breathing symptoms get worse. This information is not intended to replace advice given to you by your health care provider. Make sure you discuss any questions you have with your health care provider. Document Released: 01/19/2004 Document Revised: 11/11/2015 Document  Reviewed: 07/23/2015 Elsevier Interactive Patient Education  2019 [employer].

## 2018-08-20 ENCOUNTER — Other Ambulatory Visit: Payer: Self-pay | Admitting: Physician Assistant

## 2018-08-20 ENCOUNTER — Telehealth: Payer: Self-pay | Admitting: Nurse Practitioner

## 2018-08-20 DIAGNOSIS — E559 Vitamin D deficiency, unspecified: Secondary | ICD-10-CM

## 2018-08-20 NOTE — Telephone Encounter (Signed)
Pt had refills with the pharmacy, they will notify her when it is ready.

## 2018-08-20 NOTE — Telephone Encounter (Signed)
1) Medication(s) Requested (by name): Vitamin D, Ergocalciferol,  2) Pharmacy of Choice: [id number] - [place], [place] - [address] AT [address] 3) Special Requests:   Approved medications will be sent to the pharmacy, we will reach out if there is an issue.  Requests made after 3pm may not be addressed until the following business day!  If a patient is unsure of the name of the medication(s) please note and ask patient to call back when they are able to provide all info, do not send to responsible party until all information is available!

## 2018-09-10 ENCOUNTER — Other Ambulatory Visit: Payer: Self-pay | Admitting: Nurse Practitioner

## 2018-09-10 ENCOUNTER — Ambulatory Visit: Payer: Medicaid Other | Attending: Nurse Practitioner

## 2018-09-10 ENCOUNTER — Telehealth: Payer: Self-pay

## 2018-09-10 ENCOUNTER — Other Ambulatory Visit: Payer: Self-pay

## 2018-09-10 DIAGNOSIS — E559 Vitamin D deficiency, unspecified: Secondary | ICD-10-CM

## 2018-09-10 DIAGNOSIS — L249 Irritant contact dermatitis, unspecified cause: Secondary | ICD-10-CM

## 2018-09-10 MED ORDER — TRIAMCINOLONE ACETONIDE 0.025 % EX LOTN: TOPICAL_LOTION | CUTANEOUS | 0 refills | Status: DC

## 2018-09-10 NOTE — Telephone Encounter (Signed)
Will refill once with no refills. If her skin has not cleared she needs to let us know so we can send a referral to dermatology

## 2018-09-10 NOTE — Telephone Encounter (Signed)
Patient is requesting if PCP can refill her Triamcinolone Acetonide 0.025% LOTN to [facility].

## 2018-09-11 LAB — VITAMIN D 25 HYDROXY (VIT D DEFICIENCY, FRACTURES): Vit D, 25-Hydroxy: 44.8 ng/mL (ref 30.0–100.0)

## 2018-09-12 NOTE — Telephone Encounter (Signed)
-----   Message from Claiborne Rigg, NP sent at 09/11/2018 10:18 PM EDT ----- Vitamin D is normal. You can take 1000 units of vitamin D over the counter daily.

## 2018-09-12 NOTE — Telephone Encounter (Signed)
CMA spoke to patient to inform on lab results and PCP advising on medication.  Pt. Understood.  Pt. Verified DOB.

## 2018-09-17 ENCOUNTER — Other Ambulatory Visit: Payer: Self-pay

## 2018-09-17 ENCOUNTER — Ambulatory Visit: Payer: Medicaid Other | Attending: Nurse Practitioner | Admitting: Physician Assistant

## 2018-09-17 DIAGNOSIS — R112 Nausea with vomiting, unspecified: Secondary | ICD-10-CM

## 2018-09-17 MED ORDER — ONDANSETRON 8 MG PO TBDP: ORAL_TABLET | ORAL | 0 refills | Status: DC

## 2018-09-17 NOTE — Progress Notes (Signed)
Patient ID: [name], female   DOB: 1980/10/11, 38 y.o.   MRN: [id number]   Virtual Visit via Telephone Note  I connected with Laura Warren on 09/17/18 at  1:50 PM EDT by telephone and verified that I am speaking with the correct person using two identifiers.   I discussed the limitations, risks, security and privacy concerns of performing an evaluation and management service by telephone and the availability of in person appointments. I also discussed with the patient that there may be a patient responsible charge related to this service. The patient expressed understanding and agreed to proceed.   History of Present Illness: LMP-09/04/2018.  48hr h/o stomach pain and RUQ pain on and off.  Vomiting X1.  +nausea.  Pain is sharp and comes and goes.  Appetite is fair. Tylenol and ADVIL help some. Some pressure when she urinates but not painful.  No fever.  Doesn't use BC.      Observations/Objective:  TP linear.  A&Ox3.   Assessment and Plan: 1. Nausea and vomiting, intractability of vomiting not specified, unspecified vomiting type Advised patient to go to UC or ED as these symptoms are too complex to adequately assess over the phone.  Patient agrees.   - ondansetron (ZOFRAN ODT) 8 MG disintegrating tablet; 8mg  ODT q8hours prn nausea  Dispense: 20 tablet; Refill: 0    Follow Up Instructions: Prn with PCP    I discussed the assessment and treatment plan with the patient. The patient was provided an opportunity to ask questions and all were answered. The patient agreed with the plan and demonstrated an understanding of the instructions.   The patient was advised to call back or seek an in-person evaluation if the symptoms worsen or if the condition fails to improve as anticipated I provided 15 minutes of non-face-to-face time during this encounter.   Georgian Co, PA-C

## 2018-09-17 NOTE — Progress Notes (Signed)
Weakness, abdominal pain  N/V x 3 days. Taking OTC Tylenol and Ibuprofen  Pressure x 2 days when she finishes voiding. Denies dyuria

## 2018-09-23 ENCOUNTER — Encounter (HOSPITAL_COMMUNITY): Payer: Self-pay

## 2018-09-23 ENCOUNTER — Other Ambulatory Visit: Payer: Self-pay

## 2018-09-23 ENCOUNTER — Emergency Department (HOSPITAL_COMMUNITY)
Admission: EM | Admit: 2018-09-23 | Discharge: 2018-09-24 | Disposition: A | Discharge status: Home / Self Care | Payer: Medicaid Other | Attending: Emergency Medicine | Admitting: Emergency Medicine

## 2018-09-23 DIAGNOSIS — R1031 Right lower quadrant pain: Secondary | ICD-10-CM | POA: Insufficient documentation

## 2018-09-23 DIAGNOSIS — Z79899 Other long term (current) drug therapy: Secondary | ICD-10-CM | POA: Insufficient documentation

## 2018-09-23 DIAGNOSIS — R109 Unspecified abdominal pain: Secondary | ICD-10-CM | POA: Diagnosis not present

## 2018-09-23 DIAGNOSIS — R110 Nausea: Secondary | ICD-10-CM | POA: Insufficient documentation

## 2018-09-23 LAB — CBC
HCT: 39.1 % (ref 36.0–46.0)
Hemoglobin: 12.9 g/dL (ref 12.0–15.0)
MCH: 28.7 pg (ref 26.0–34.0)
MCHC: 33 g/dL (ref 30.0–36.0)
MCV: 86.9 fL (ref 80.0–100.0)
Platelets: 187 10*3/uL (ref 150–400)
RBC: 4.5 MIL/uL (ref 3.87–5.11)
RDW: 12 % (ref 11.5–15.5)
WBC: 6.3 10*3/uL (ref 4.0–10.5)
nRBC: 0 % (ref 0.0–0.2)

## 2018-09-23 LAB — URINALYSIS, ROUTINE W REFLEX MICROSCOPIC
Bacteria, UA: NONE SEEN
Bilirubin Urine: NEGATIVE
Glucose, UA: NEGATIVE mg/dL
Ketones, ur: NEGATIVE mg/dL
Nitrite: NEGATIVE
Protein, ur: NEGATIVE mg/dL
Specific Gravity, Urine: 1.003 — ABNORMAL LOW (ref 1.005–1.030)
pH: 6 (ref 5.0–8.0)

## 2018-09-23 LAB — COMPREHENSIVE METABOLIC PANEL
ALT: 13 U/L (ref 0–44)
AST: 16 U/L (ref 15–41)
Albumin: 4 g/dL (ref 3.5–5.0)
Alkaline Phosphatase: 29 U/L — ABNORMAL LOW (ref 38–126)
Anion gap: 7 (ref 5–15)
BUN: 13 mg/dL (ref 6–20)
CO2: 25 mmol/L (ref 22–32)
Calcium: 9 mg/dL (ref 8.9–10.3)
Chloride: 107 mmol/L (ref 98–111)
Creatinine, Ser: 1 mg/dL (ref 0.44–1.00)
GFR calc Af Amer: >60 mL/min (ref >60)
GFR calc non Af Amer: >60 mL/min (ref >60)
Glucose, Bld: 113 mg/dL — ABNORMAL HIGH (ref 70–99)
Potassium: 3.9 mmol/L (ref 3.5–5.1)
Sodium: 139 mmol/L (ref 135–145)
Total Bilirubin: 0.4 mg/dL (ref 0.3–1.2)
Total Protein: 7 g/dL (ref 6.5–8.1)

## 2018-09-23 LAB — I-STAT BETA HCG BLOOD, ED (MC, WL, AP ONLY): I-stat hCG, quantitative: <5 m[IU]/mL (ref <5)

## 2018-09-23 LAB — LIPASE, BLOOD: Lipase: 52 U/L — ABNORMAL HIGH (ref 11–51)

## 2018-09-23 NOTE — ED Triage Notes (Signed)
Patient c/o RLQ abd pain and called her Dr. whom prescribed nausea and pain medicines. Last BM this a.m. and no problem with voiding.

## 2018-09-24 ENCOUNTER — Emergency Department (HOSPITAL_COMMUNITY): Payer: Medicaid Other

## 2018-09-24 DIAGNOSIS — R109 Unspecified abdominal pain: Secondary | ICD-10-CM | POA: Diagnosis not present

## 2018-09-24 MED ORDER — DICYCLOMINE HCL 20 MG PO TABS: 20.0000 mg | ORAL_TABLET | Freq: Two times a day (BID) | ORAL | 0 refills | Status: DC

## 2018-09-24 MED ORDER — METOCLOPRAMIDE HCL 10 MG PO TABS: 10.0000 mg | ORAL_TABLET | Freq: Three times a day (TID) | ORAL | 0 refills | Status: DC | PRN

## 2018-09-24 NOTE — ED Provider Notes (Signed)
[facility] EMERGENCY DEPARTMENT Provider Note   CSN: [id number] Arrival date & time: 09/23/18  2023    History   Chief Complaint Chief Complaint  Patient presents with  . Abdominal Pain    HPI Laura Warren is a 38 y.o. female.     The history is provided by the patient and medical records.  Abdominal Pain  Associated symptoms: nausea      38 y.o. F here with right lower abdominal pain for the past 9 days.  States pain localized to RLQ, described as burning, waxes and wanes in severity.  Pain feels better when pressure applied. States she feels really nauseated and does not want to eat at all.  She has been drinking fluids and cranberry juice thinking it was a UTI.  She did initially have some dysuria and pressure when urinating but that has since resolved.  She denies any diarrhea.  No pelvic pain, vaginal discharge, irregular menses, or pain with intercourse.  She has history of ovarian cyst in the past but no issues from this.  She is s/p appendectomy 10+ years ago.  No other surgical history.  She had an e-visit with her doctor last week and was prescribed some medication but has not had any relief from this.  Past Medical History:  Diagnosis Date  . Medical history non-contributory     Patient Active Problem List   Diagnosis Date Noted  . Uterine contractions during pregnancy 06/23/2016  . Currently pregnant 11/14/2015  . Lumbar back pain 09/03/2014    Past Surgical History:  Procedure Laterality Date  . APPENDECTOMY       OB History    Gravida  2   Para  2   Term  2   Preterm  0   AB  0   Living  2     SAB  0   TAB  0   Ectopic  0   Multiple  0   Live Births  2            Home Medications    Prior to Admission medications   Medication Sig Start Date End Date Taking? Authorizing Provider  benzonatate (TESSALON) 100 MG capsule Take 2 capsules (200 mg total) by mouth 3 (three) times daily as needed for cough.  Patient not taking: Reported on 09/17/2018 07/08/18   Claiborne Rigg, NP  ondansetron [facility] ODT) 8 MG disintegrating tablet  ODT q8hours prn nausea 09/17/18   Anders Simmonds, PA-C  Triamcinolone Acetonide 0.025 % LOTN To both arms twice daily. 09/10/18   Claiborne Rigg, NP  Vitamin D, Ergocalciferol, (DRISDOL) 1.25 MG (50000 UT) CAPS capsule Take 1 capsule (50,000 Units total) by mouth every 7 (seven) days. 07/08/18   Claiborne Rigg, NP    Family History Family History  Problem Relation Age of Onset  . Cancer Mother     Social History Social History   Tobacco Use  . Smoking status: Never Smoker  . Smokeless tobacco: Never Used  Substance Use Topics  . Alcohol use: No  . Drug use: Never     Allergies   Patient has no known allergies.   Review of Systems Review of Systems  Gastrointestinal: Positive for abdominal pain and nausea.  All other systems reviewed and are negative.    Physical Exam Updated Vital Signs BP 116/77   Pulse 83   Temp 98.1 F (36.7 C) (Oral)   Resp 16   Ht  (  1.651 m)   Wt 58.1 kg   LMP 09/04/2018   SpO2 99%   BMI 21.30 kg/m   Physical Exam Vitals signs and nursing note reviewed.  Constitutional:      Appearance: She is well-developed.  HENT:     Head: Normocephalic and atraumatic.  Eyes:     Conjunctiva/sclera: Conjunctivae normal.     Pupils: Pupils are equal, round, and reactive to light.  Neck:     Musculoskeletal: Normal range of motion.  Cardiovascular:     Rate and Rhythm: Normal rate and regular rhythm.     Heart sounds: Normal heart sounds.  Pulmonary:     Effort: Pulmonary effort is normal.     Breath sounds: Normal breath sounds.  Abdominal:     General: Bowel sounds are normal.     Palpations: Abdomen is soft.     Tenderness: There is abdominal tenderness in the right lower quadrant.    Musculoskeletal: Normal range of motion.  Skin:    General: Skin is warm and dry.  Neurological:     Mental Status:  She is alert and oriented to person, place, and time.      ED Treatments / Results  Labs (all labs ordered are listed, but only abnormal results are displayed) Labs Reviewed  LIPASE, BLOOD - Abnormal; Notable for the following components:      Result Value   Lipase 52 (*)    All other components within normal limits  COMPREHENSIVE METABOLIC PANEL - Abnormal; Notable for the following components:   Glucose, Bld 113 (*)    Alkaline Phosphatase 29 (*)    All other components within normal limits  URINALYSIS, ROUTINE W REFLEX MICROSCOPIC - Abnormal; Notable for the following components:   Color, Urine COLORLESS (*)    Specific Gravity, Urine 1.003 (*)    Hgb urine dipstick SMALL (*)    Leukocytes,Ua TRACE (*)    All other components within normal limits  CBC  I-STAT BETA HCG BLOOD, ED ([facility], [facility], [facility] ONLY)    EKG None  Radiology Ct Renal Stone Study  Result Date: 09/24/2018 CLINICAL DATA:  38 year old female with right side abdominal pain. Reports prior appendectomy. EXAM: CT ABDOMEN AND PELVIS WITHOUT CONTRAST TECHNIQUE: Multidetector CT imaging of the abdomen and pelvis was performed following the standard protocol without IV contrast. COMPARISON:  Pelvis ultrasound 12/03/2017 and earlier. FINDINGS: Lower chest: Negative. Hepatobiliary: Negative noncontrast liver and gallbladder. The gallbladder is contracted. Pancreas: Negative. Spleen: Negative. Adrenals/Urinary Tract: Normal adrenal glands. No nephrolithiasis. Negative noncontrast left kidney and proximal left ureter. The right renal pelvis is asymmetrically larger, but there is no convincing right hydronephrosis and the proximal right ureter appears normal. No calculus identified along the course of the right ureter. Unremarkable urinary bladder. Stomach/Bowel: Negative rectosigmoid colon, redundant sigmoid. Negative descending colon. Mild retained stool in the transverse and right colon. Appendix not identified. No pericecal  inflammation. Negative terminal ileum. Flocculated material in the distal small bowel, but no dilated small bowel loops. Unremarkable stomach. No mesenteric stranding. No free air, free fluid. Vascular/Lymphatic: Vascular patency is not evaluated in the absence of IV contrast. No lymphadenopathy. Reproductive: Negative noncontrast appearance. Other: No pelvic free fluid. Musculoskeletal: Negative. IMPRESSION: Negative noncontrast CT Abdomen and Pelvis. Electronically Signed   By: Odessa FlemingH  Hall M.D.   On: 09/24/2018 00:43    Procedures Procedures (including critical care time)  Medications Ordered in ED Medications - No data to display   Initial Impression / Assessment and Plan /  ED Course  I have reviewed the triage vital signs and the nursing notes.  Pertinent labs & imaging results that were available during my care of the patient were reviewed by me and considered in my medical decision making (see chart for details).  38 year old female presenting to the ED with right lower abdominal pain.  This is been ongoing for about 9 days.  She has had some nausea and decreased appetite but denies any diarrhea.  She is not had any fevers, recent travel, or sick contacts.  She had an ED visit with her doctor last week and was prescribed Zofran but has not had much relief.  She is afebrile and nontoxic in appearance here.  She does have tenderness in the right lower quadrant without peritoneal signs.  She is status post appendectomy more than 10 years ago.  She is not had any other abdominal surgeries.  Screening labs obtained from triage are overall reassuring.  UA with blood present but no signs of infection.  She has not noticed any gross hematuria.  CT renal study obtained without any acute findings.  This may represent viral process.  Patient has not had any active emesis here in the ED and vitals are stable.  Feel she stable for discharge home with symptomatic care.  Recommended good oral hydration, gentle  diet progress back to normal as tolerated.  Can follow-up with PCP.  Return here for any new/acute changes.  Final Clinical Impressions(s) / ED Diagnoses   Final diagnoses:  Right sided abdominal pain  Nausea    ED Discharge Orders         Ordered    dicyclomine (BENTYL) 20 MG tablet  2 times daily     09/24/18 0056    metoCLOPramide (REGLAN) 10 MG tablet  3 times daily PRN     09/24/18 0056           Garlon Hatchet, PA-C 09/24/18 [id number]    Wilkie Aye Mayer Masker, MD 09/24/18 [phone]

## 2018-09-24 NOTE — Discharge Instructions (Signed)
Take the prescribed medication as directed.  Make sure to continue drinking fluids. I would start with bland diet and progress back to normal as tolerated. Follow-up with your primary care doctor. Return to the ED for new or worsening symptoms.

## 2019-01-07 ENCOUNTER — Ambulatory Visit: Payer: Medicaid Other | Attending: Family Medicine | Admitting: Family Medicine

## 2019-01-07 ENCOUNTER — Encounter: Payer: Self-pay | Admitting: Family Medicine

## 2019-01-07 DIAGNOSIS — M79641 Pain in right hand: Secondary | ICD-10-CM | POA: Diagnosis not present

## 2019-01-07 DIAGNOSIS — M25562 Pain in left knee: Secondary | ICD-10-CM | POA: Diagnosis not present

## 2019-01-07 DIAGNOSIS — M25561 Pain in right knee: Secondary | ICD-10-CM | POA: Diagnosis not present

## 2019-01-07 DIAGNOSIS — M79642 Pain in left hand: Secondary | ICD-10-CM | POA: Diagnosis not present

## 2019-01-07 MED ORDER — METHOCARBAMOL 500 MG PO TABS: 500.0000 mg | ORAL_TABLET | Freq: Three times a day (TID) | ORAL | 0 refills | Status: DC | PRN

## 2019-01-07 MED ORDER — NAPROXEN 500 MG PO TABS: 500.0000 mg | ORAL_TABLET | Freq: Two times a day (BID) | ORAL | 0 refills | Status: DC

## 2019-01-07 NOTE — Progress Notes (Signed)
Virtual Visit via Telephone Note  I connected with Raygen Linquist, on 01/07/2019 at 1:41 PM by telephone due to the COVID-19 pandemic and verified that I am speaking with the correct person using two identifiers.   Consent: I discussed the limitations, risks, security and privacy concerns of performing an evaluation and management service by telephone and the availability of in person appointments. I also discussed with the patient that there may be a patient responsible charge related to this service. The patient expressed understanding and agreed to proceed.   Location of Patient: Home  Location of Provider: Clinic   Persons participating in Telemedicine visit: Shaylea Cesar Alf Farrington-CMA Dr. Felecia Shelling     History of Present Illness: 38 year old female who presents today complaining about pain in her knees and prolonged standing and pain in both hands with driving which she complains of for the last 1 month and is rated as a 7/10.  She has to alternate from one hand to the other due to pain in her wrist but denies presence of swelling.  She describes this as  "my hands are tired".  Denies presence of numbness or reduced hand strength. She also endorses cramping in her legs which occur at night and has to use ibuprofen and Tylenol for relief. She does not have generalized myalgias or arthralgias in other joints besides those she has complained of.  Denies presence of fever.  Past Medical History:  Diagnosis Date  . Medical history non-contributory    No Known Allergies  Current Outpatient Medications on File Prior to Visit  Medication Sig Dispense Refill  . benzonatate (TESSALON) 100 MG capsule Take 2 capsules (200 mg total) by mouth 3 (three) times daily as needed for cough. (Patient not taking: Reported on 09/17/2018) 30 capsule 0  . dicyclomine (BENTYL) 20 MG tablet Take 1 tablet (20 mg total) by mouth 2 (two) times daily. (Patient not taking: Reported on 01/07/2019)  20 tablet 0  . metoCLOPramide (REGLAN) 10 MG tablet Take 1 tablet (10 mg total) by mouth 3 (three) times daily as needed for nausea (headache / nausea). (Patient not taking: Reported on 01/07/2019) 20 tablet 0  . ondansetron (ZOFRAN ODT) 8 MG disintegrating tablet 8mg  ODT q8hours prn nausea (Patient not taking: Reported on 01/07/2019) 20 tablet 0  . Triamcinolone Acetonide 0.025 % LOTN To both arms twice daily. (Patient not taking: Reported on 01/07/2019) 60 mL 0  . Vitamin D, Ergocalciferol, (DRISDOL) 1.25 MG (50000 UT) CAPS capsule Take 1 capsule (50,000 Units total) by mouth every 7 (seven) days. (Patient not taking: Reported on 01/07/2019) 16 capsule 0   No current facility-administered medications on file prior to visit.     Observations/Objective: Awake, alert, oriented x3 Not in acute distress  Assessment and Plan: 1. Acute pain of both knees Unknown etiology She will need an in person visit for evaluation and also labs - naproxen (NAPROSYN) 500 MG tablet; Take 1 tablet (500 mg total) by mouth 2 (two) times daily with a meal.  Dispense: 30 tablet; Refill: 0 - methocarbamol (ROBAXIN) 500 MG tablet; Take 1 tablet (500 mg total) by mouth every 8 (eight) hours as needed for muscle spasms.  Dispense: 60 tablet; Refill: 0  2. Pain in both hands We will need to exclude autoimmune condition at next visit where she will need labs if still persistent - naproxen (NAPROSYN) 500 MG tablet; Take 1 tablet (500 mg total) by mouth 2 (two) times daily with a meal.  Dispense: 30 tablet; Refill:  0 - methocarbamol (ROBAXIN) 500 MG tablet; Take 1 tablet (500 mg total) by mouth every 8 (eight) hours as needed for muscle spasms.  Dispense: 60 tablet; Refill: 0   Follow Up Instructions: Return in about 1 month (around 02/06/2019) for PCP- Zelda: follow up on joint pains.    I discussed the assessment and treatment plan with the patient. The patient was provided an opportunity to ask questions and all were  answered. The patient agreed with the plan and demonstrated an understanding of the instructions.   The patient was advised to call back or seek an in-person evaluation if the symptoms worsen or if the condition fails to improve as anticipated.     I provided 15 minutes total of non-face-to-face time during this encounter including median intraservice time, reviewing previous notes, labs, imaging, medications, management and patient verbalized understanding.     Hoy RegisterEnobong Moriyah Byington, MD, FAAFP. [facility] [place], [place] [phone]   01/07/2019, 1:41 PM

## 2019-01-07 NOTE — Progress Notes (Signed)
Patient has been called and DOB has been verified. Patient has been screened and transferred to PCP to start phone visit.   Patient states that she is having joint pain in both legs.

## 2019-02-23 ENCOUNTER — Ambulatory Visit: Payer: Medicaid Other | Attending: Nurse Practitioner | Admitting: Nurse Practitioner

## 2019-02-23 ENCOUNTER — Other Ambulatory Visit: Payer: Self-pay

## 2019-02-23 ENCOUNTER — Ambulatory Visit (HOSPITAL_BASED_OUTPATIENT_CLINIC_OR_DEPARTMENT_OTHER): Payer: Medicaid Other | Admitting: Pharmacist

## 2019-02-23 ENCOUNTER — Encounter: Payer: Self-pay | Admitting: Nurse Practitioner

## 2019-02-23 VITALS — BP 106/74 | HR 73 | Temp 98.5°F | Ht 65.0 in | Wt 139.0 lb

## 2019-02-23 DIAGNOSIS — E871 Hypo-osmolality and hyponatremia: Secondary | ICD-10-CM | POA: Diagnosis not present

## 2019-02-23 DIAGNOSIS — M2550 Pain in unspecified joint: Secondary | ICD-10-CM | POA: Insufficient documentation

## 2019-02-23 DIAGNOSIS — Z13220 Encounter for screening for lipoid disorders: Secondary | ICD-10-CM | POA: Diagnosis not present

## 2019-02-23 DIAGNOSIS — Z23 Encounter for immunization: Secondary | ICD-10-CM

## 2019-02-23 NOTE — Progress Notes (Signed)
Patient presents for vaccination against influenza per orders of Zelda. Consent given. Counseling provided. No contraindications exists. Vaccine administered without incident.   

## 2019-02-23 NOTE — Progress Notes (Signed)
Assessment & Plan:  Kordelia was seen today for follow-up.  Diagnoses and all orders for this visit:  Arthralgia of multiple joints Almost completely resolved Recommended home yoga and Pilates routine  Hyponatremia -     CMP14+EGFR  Lipid screening -     Lipid panel    Patient has been counseled on age-appropriate routine health concerns for screening and prevention. These are reviewed and up-to-date. Referrals have been placed accordingly. Immunizations are up-to-date or declined.    Subjective:   Chief Complaint  Patient presents with  . Follow-up    Pt. stated her joint pain comes and go.    HPI Corey Caulfield 38 y.o. female presents to office today for follow up to multiple joint arthralgias.  She is doing well today with almost complete resolution of joint pain.  Rheumatoid factor negative.  Taking naproxen sparingly and has no additional complaints or concerns today.  Will schedule Pap for next office visit. Denies chest pain, shortness of breath, palpitations, lightheadedness, dizziness, headaches or BLE edema.     Review of Systems  Constitutional: Negative for fever, malaise/fatigue and weight loss.  HENT: Negative.  Negative for nosebleeds.   Eyes: Negative.  Negative for blurred vision, double vision and photophobia.  Respiratory: Negative.  Negative for cough and shortness of breath.   Cardiovascular: Negative.  Negative for chest pain, palpitations and leg swelling.  Gastrointestinal: Negative.  Negative for heartburn, nausea and vomiting.  Musculoskeletal: Negative.  Negative for myalgias.  Neurological: Negative.  Negative for dizziness, focal weakness, seizures and headaches.  Psychiatric/Behavioral: Negative.  Negative for suicidal ideas.    Past Medical History:  Diagnosis Date  . Medical history non-contributory     Past Surgical History:  Procedure Laterality Date  . APPENDECTOMY      Family History  Problem Relation Age of Onset  .  Cancer Mother     Social History Reviewed with no changes to be made today.   Outpatient Medications Prior to Visit  Medication Sig Dispense Refill  . Acetaminophen (TYLENOL PO) Take by mouth.    . methocarbamol (ROBAXIN) 500 MG tablet Take 1 tablet (500 mg total) by mouth every 8 (eight) hours as needed for muscle spasms. (Patient not taking: Reported on 02/23/2019) 60 tablet 0  . naproxen (NAPROSYN) 500 MG tablet Take 1 tablet (500 mg total) by mouth 2 (two) times daily with a meal. (Patient not taking: Reported on 02/23/2019) 30 tablet 0  . benzonatate (TESSALON) 100 MG capsule Take 2 capsules (200 mg total) by mouth 3 (three) times daily as needed for cough. (Patient not taking: Reported on 09/17/2018) 30 capsule 0  . dicyclomine (BENTYL) 20 MG tablet Take 1 tablet (20 mg total) by mouth 2 (two) times daily. (Patient not taking: Reported on 01/07/2019) 20 tablet 0  . metoCLOPramide (REGLAN) 10 MG tablet Take 1 tablet (10 mg total) by mouth 3 (three) times daily as needed for nausea (headache / nausea). (Patient not taking: Reported on 01/07/2019) 20 tablet 0  . ondansetron (ZOFRAN ODT) 8 MG disintegrating tablet 77m ODT q8hours prn nausea (Patient not taking: Reported on 01/07/2019) 20 tablet 0  . Triamcinolone Acetonide 0.025 % LOTN To both arms twice daily. (Patient not taking: Reported on 01/07/2019) 60 mL 0  . Vitamin D, Ergocalciferol, (DRISDOL) 1.25 MG (50000 UT) CAPS capsule Take 1 capsule (50,000 Units total) by mouth every 7 (seven) days. (Patient not taking: Reported on 01/07/2019) 16 capsule 0   No facility-administered medications prior to  visit.     No Known Allergies     Objective:    BP 106/74 (BP Location: Left Arm, Patient Position: Sitting, Cuff Size: Normal)   Pulse 73   Temp 98.5 F (36.9 C) (Oral)   Ht '5\' 5"'  (1.651 m)   Wt 139 lb (63 kg)   LMP 02/05/2019   SpO2 99%   BMI 23.13 kg/m  Wt Readings from Last 3 Encounters:  02/23/19 139 lb (63 kg)  09/23/18 128 lb (58.1  kg)  07/08/18 128 lb 12.8 oz (58.4 kg)    Physical Exam Vitals signs and nursing note reviewed.  Constitutional:      Appearance: She is well-developed.  HENT:     Head: Normocephalic and atraumatic.  Neck:     Musculoskeletal: Normal range of motion.  Cardiovascular:     Rate and Rhythm: Normal rate and regular rhythm.     Heart sounds: Normal heart sounds. No murmur. No friction rub. No gallop.   Pulmonary:     Effort: Pulmonary effort is normal. No tachypnea or respiratory distress.     Breath sounds: Normal breath sounds. No decreased breath sounds, wheezing, rhonchi or rales.  Chest:     Chest wall: No tenderness.  Abdominal:     General: Bowel sounds are normal.     Palpations: Abdomen is soft.  Musculoskeletal: Normal range of motion.  Skin:    General: Skin is warm and dry.  Neurological:     Mental Status: She is alert and oriented to person, place, and time.     Coordination: Coordination normal.  Psychiatric:        Behavior: Behavior normal. Behavior is cooperative.        Thought Content: Thought content normal.        Judgment: Judgment normal.          Patient has been counseled extensively about nutrition and exercise as well as the importance of adherence with medications and regular follow-up. The patient was given clear instructions to go to ER or return to medical center if symptoms don't improve, worsen or new problems develop. The patient verbalized understanding.   Follow-up: Return for PAP SMEAR.   Gildardo Pounds, FNP-BC [facility], [place]   02/23/2019, 1:14 PM

## 2019-02-24 LAB — LIPID PANEL
Chol/HDL Ratio: 4.2 ratio (ref 0.0–4.4)
Cholesterol, Total: 159 mg/dL (ref 100–199)
HDL: 38 mg/dL — ABNORMAL LOW (ref >39)
LDL Chol Calc (NIH): 95 mg/dL (ref 0–99)
Triglycerides: 149 mg/dL (ref 0–149)
VLDL Cholesterol Cal: 26 mg/dL (ref 5–40)

## 2019-02-24 LAB — CMP14+EGFR
ALT: 12 IU/L (ref 0–32)
AST: 14 IU/L (ref 0–40)
Albumin/Globulin Ratio: 2 (ref 1.2–2.2)
Albumin: 4.1 g/dL (ref 3.8–4.8)
Alkaline Phosphatase: 29 IU/L — ABNORMAL LOW (ref 39–117)
BUN/Creatinine Ratio: 15 (ref 9–23)
BUN: 9 mg/dL (ref 6–20)
Bilirubin Total: 0.7 mg/dL (ref 0.0–1.2)
CO2: 20 mmol/L (ref 20–29)
Calcium: 9 mg/dL (ref 8.7–10.2)
Chloride: 107 mmol/L — ABNORMAL HIGH (ref 96–106)
Creatinine, Ser: 0.59 mg/dL (ref 0.57–1.00)
GFR calc Af Amer: 134 mL/min/{1.73_m2} (ref >59)
GFR calc non Af Amer: 117 mL/min/{1.73_m2} (ref >59)
Globulin, Total: 2.1 g/dL (ref 1.5–4.5)
Glucose: 62 mg/dL — ABNORMAL LOW (ref 65–99)
Potassium: 4.1 mmol/L (ref 3.5–5.2)
Sodium: 141 mmol/L (ref 134–144)
Total Protein: 6.2 g/dL (ref 6.0–8.5)

## 2019-02-25 ENCOUNTER — Other Ambulatory Visit: Payer: Self-pay | Admitting: Nurse Practitioner

## 2019-02-25 DIAGNOSIS — R748 Abnormal levels of other serum enzymes: Secondary | ICD-10-CM

## 2019-02-26 ENCOUNTER — Encounter: Payer: Self-pay | Admitting: Gastroenterology

## 2019-03-03 ENCOUNTER — Telehealth: Payer: Self-pay | Admitting: Nurse Practitioner

## 2019-03-03 NOTE — Telephone Encounter (Signed)
Patient called for her lab results. Please follow up.

## 2019-03-03 NOTE — Telephone Encounter (Signed)
Spoke to patient. Pt. Was inform on results and referrals.

## 2019-03-10 ENCOUNTER — Other Ambulatory Visit (INDEPENDENT_AMBULATORY_CARE_PROVIDER_SITE_OTHER): Payer: Medicaid Other

## 2019-03-10 ENCOUNTER — Encounter: Payer: Self-pay | Admitting: Nurse Practitioner

## 2019-03-10 ENCOUNTER — Ambulatory Visit (INDEPENDENT_AMBULATORY_CARE_PROVIDER_SITE_OTHER): Payer: Medicaid Other | Admitting: Nurse Practitioner

## 2019-03-10 VITALS — BP 120/62 | Temp 97.4°F | Ht 65.0 in | Wt 137.2 lb

## 2019-03-10 DIAGNOSIS — R748 Abnormal levels of other serum enzymes: Secondary | ICD-10-CM | POA: Diagnosis not present

## 2019-03-10 LAB — VITAMIN B12: Vitamin B-12: 375 pg/mL (ref 211–911)

## 2019-03-10 LAB — TSH: TSH: 0.02 u[IU]/mL — ABNORMAL LOW (ref 0.35–4.50)

## 2019-03-10 NOTE — Patient Instructions (Addendum)
Your provider has requested that you go to the basement level for lab work before leaving today. Press "B" on the elevator. The lab is located at the first door on the left as you exit the elevator.  You have been scheduled for an abdominal ultrasound at [facility] on 03/16/2019 at 8:00AM. Please arrive 20 minutes prior to your appointment for registration. Make certain not to have anything to eat or drink 6 hours prior to your appointment. Should you need to reschedule your appointment, please contact radiology at [phone] This test typically takes about 30 minutes to perform.    Follow up with your Primary Care Doctor to consider an endocrinology consult to rule out adult hypophosphatasias.   Follow up with [place] as needed.    I appreciate the opportunity to care for you. Carl Best, CRNP

## 2019-03-10 NOTE — Progress Notes (Signed)
03/10/2019 Laura Warren [id number] 1980/10/17   HISTORY OF PRESENT ILLNESS:Laura Warren is a 38 year old female whose past medical history includes vitamin D deficiency otherwise no significant health issues.  Past appendectomy at the age of 36.  She presents today for further evaluation regarding a low alk phosphatase level. She underwent laboratory studies dating back to 06/17/2018 which showed a low alk phos level of 30.  Repeat alk phos levels remain consistently low at 29.  Normal AST and ALT levels.  No history of liver disease. Normal BUN and Cr. No history of kidney disease. She reports having a low vitamin D level in the past, she previously took vitamin D supplements and her levels normalized.  Her last TSH level was reported as normal least 1 year ago.  She does not exhibit any signs of malnutrition.  She eats 3 regular meals daily.  She is not a vegetarian.  Approximately 2-1/2 years ago her prepregnancy weight was 128.  During her pregnancy,  her weight went up to 180 pounds.  Over the next 2 years, she continued to lose weight.  She weighs 137 pounds today which she reports has been stable for the past 6 months.  She denies having any dental issues.  She denies having any past bone fractures.  She had mild nausea in her and right lower quadrant abdominal pain May 2020 for which she went to the emergency room and a CT of the abdomen pelvis was negative.  She was prescribed prescribed Dicyclomine and Reglan and her symptoms abated.  She reported having bilateral knee pain, calf pain and noticeable fatigue to her arms and hands for approximately 1 year.  She was seen by her PCP 01/2019 for for this issue.  A rheumatoid factor test was negative.  She was prescribed Naproxen and Robaxin and knee and calf pains abated.  She continues to have infrequent weakness to her upper extremities which might occur if she is doing dishes for a prolonged period of time.  She is no longer concerned  regarding this issue.  Her mother mother died at the age of 6 secondary to lung cancer.  Her father is living at the age of 55 with history of of heart disease.  She has 4 brothers and 4 sisters who are all healthy.  She is originally from [place].  She drinks a very small glass of wine infrequently.  No drug use.   CMP Latest Ref Rng & Units 02/23/2019 09/23/2018 06/17/2018  Glucose 65 - 99 mg/dL 62(L) 113(H) 120(H)  BUN 6 - 20 mg/dL '9 13 16  ' Creatinine 0.57 - 1.00 mg/dL 0.59 1.00 0.70  Sodium 134 - 144 mmol/L 141 139 132(L)  Potassium 3.5 - 5.2 mmol/L 4.1 3.9 3.4(L)  Chloride 96 - 106 mmol/L 107(H) 107 102  CO2 20 - 29 mmol/L '20 25 23  ' Calcium 8.7 - 10.2 mg/dL 9.0 9.0 8.7(L)  Total Protein 6.0 - 8.5 g/dL 6.2 7.0 6.8  Total Bilirubin 0.0 - 1.2 mg/dL 0.7 0.4 0.4  Alkaline Phos 39 - 117 IU/L 29(L) 29(L) 30(L)  AST 0 - 40 IU/L '14 16 15  ' ALT 0 - 32 IU/L '12 13 13   ' CBC Latest Ref Rng & Units 09/23/2018 06/17/2018 01/24/2017  WBC 4.0 - 10.5 K/uL 6.3 6.9 6.1  Hemoglobin 12.0 - 15.0 g/dL 12.9 13.0 13.3  Hematocrit 36.0 - 46.0 % 39.1 40.3 40.9  Platelets 150 - 400 K/uL 187 179 210  Past Medical History:  Diagnosis Date   Medical history non-contributory    Past Surgical History:  Procedure Laterality Date   APPENDECTOMY      reports that she has never smoked. She has never used smokeless tobacco. She reports that she does not drink alcohol or use drugs. family history includes Cancer in her mother. No Known Allergies    Outpatient Encounter Medications as of 03/10/2019  Medication Sig   Acetaminophen (TYLENOL PO) Take by mouth.   naproxen (NAPROSYN) 500 MG tablet Take 1 tablet (500 mg total) by mouth 2 (two) times daily with a meal. (Patient not taking: Reported on 02/23/2019)   No facility-administered encounter medications on file as of 03/10/2019.      REVIEW OF SYSTEMS  : All other systems reviewed and negative except where noted in the History of Present  Illness.   PHYSICAL EXAM: There were no vitals taken for this visit. General: Well developed white female in no acute distress Head: Normocephalic and atraumatic Eyes:  sclerae anicteric,conjunctive pink. Ears: Normal auditory acuity Mouth: Dentition intact, no ulcers or lesions. Neck: Supple, no masses.  Lungs: Clear throughout to auscultation Heart: Regular rate and rhythm Abdomen: Soft, nontender, non distended. No masses or hepatomegaly noted. Normal bowel sounds. RLQ scar intact. Small superficial fatty not to the RLQ adjacent to her scar. Rectal: Deferred. Musculoskeletal: Symmetrical with no gross deformities  Skin: No lesions on visible extremities Extremities: No edema  Neurological: Alert oriented x 4, grossly nonfocal Cervical Nodes:  No significant cervical adenopathy Psychological:  Alert and cooperative. Normal mood and affect  ASSESSMENT AND PLAN:  13. 38 year old female with low alkaline phosphatase levels unlikely hepatic etiology. Rule out adult hypophosphatasia. -check zinc level, TSH, Vitamin B12, Mg, ceruloplasmin,  -abdominal sonogram  -recommend endocrinology referral -Further follow-up to be completed after the above lab results abdominal sonogram results reviewed  2. Knee and hand joint pain, resolved.  Bilateral upper extremity weakness/fatigue episodes less frequent. -Followed by PCP   CC:  Gildardo Pounds, NP

## 2019-03-11 ENCOUNTER — Ambulatory Visit: Payer: Medicaid Other | Admitting: Gastroenterology

## 2019-03-11 NOTE — Progress Notes (Signed)
Attending Physician's Attestation   I have reviewed the chart.   I agree with the Advanced Practitioner's note, impression, and recommendations with any updates as below.  Interesting case.  Will rule out as noted above issues of hypophosphatemia.  Wilson's disease can present with very low alkaline phosphatase but usually has other signs that would suggest issues from a hepatic dysfunction and I do not suspect this.  Would plan to follow up the imaging studies of the liver before further significant workup or liver biopsy, which I suspect will be unlikely.  Justice Britain, MD [facility] Advanced Endoscopy Office # [phone]

## 2019-03-12 LAB — ZINC: Zinc: 48 ug/dL — ABNORMAL LOW (ref 60–130)

## 2019-03-12 LAB — CERULOPLASMIN: Ceruloplasmin: 27 mg/dL (ref 18–53)

## 2019-03-16 ENCOUNTER — Other Ambulatory Visit: Payer: Medicaid Other

## 2019-03-19 ENCOUNTER — Ambulatory Visit
Admission: RE | Admit: 2019-03-19 | Discharge: 2019-03-19 | Disposition: A | Discharge status: Home / Self Care | Payer: Medicaid Other | Source: Ambulatory Visit | Attending: Nurse Practitioner | Admitting: Nurse Practitioner

## 2019-03-19 DIAGNOSIS — R748 Abnormal levels of other serum enzymes: Secondary | ICD-10-CM

## 2019-03-27 ENCOUNTER — Telehealth (INDEPENDENT_AMBULATORY_CARE_PROVIDER_SITE_OTHER): Payer: Self-pay | Admitting: Nurse Practitioner

## 2019-03-27 DIAGNOSIS — R748 Abnormal levels of other serum enzymes: Secondary | ICD-10-CM

## 2019-03-27 DIAGNOSIS — K760 Fatty (change of) liver, not elsewhere classified: Secondary | ICD-10-CM

## 2019-03-27 NOTE — Telephone Encounter (Signed)
Bre, pls provide patient with order for Hep C antibody, Hep B surface antigen, Hep B core IgG total and Hep B surface antibody. Dx code for fatty liver and abnormal alk phos (R74.8). Pls have pt schedule office follow up in 3 months. Pt is aware you will contact her next week. thx

## 2019-03-30 NOTE — Telephone Encounter (Signed)
Called and spoke with patient-patient given information/recommendations from provider; patient verbalized understanding of information and will complete the requested lab work at her convenience and begin on OTC Zinc; Patient advised to call back to the office at [phone] should questions/concerns arise;  Patient verbalized understanding of information/instructions; orders for lab work placed in [employer];

## 2019-03-30 NOTE — Telephone Encounter (Signed)
Bre, in addition to the prior instructions 11/20 (hep B and C labs within the next week or so), please make sure patient is taking Zinc 50mg  one tab once daily purchased otc. I discussed this with her over the phone but make sure she remembered these instructions. Check Hepatic panel and INR in 3 months and follow up in the office with Dr. Rush Landmark in 3 months.  THX

## 2019-04-11 IMAGING — US US PELVIS COMPLETE
1 series · 13 of 25 positions shown · non-contrast
Comparison: Prior ultrasound from 04/13/2016.

CLINICAL DATA: Initial evaluation for pelvic pain.



[Series 1: us pelvis complete · 0.20mm/px · 13 of 70 slices shown]
[im 1/70]
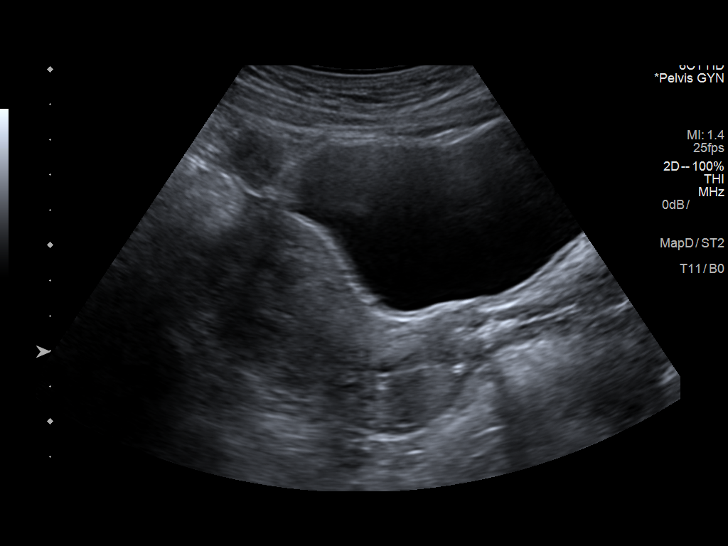
[im 6/70]
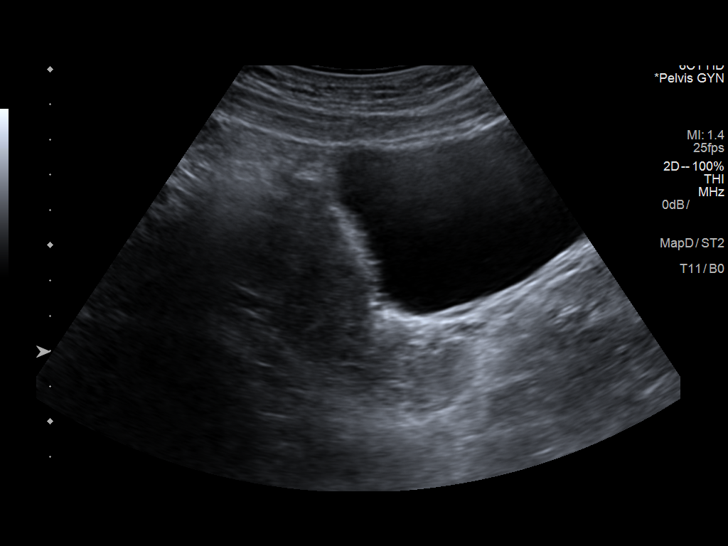
[im 12/70]
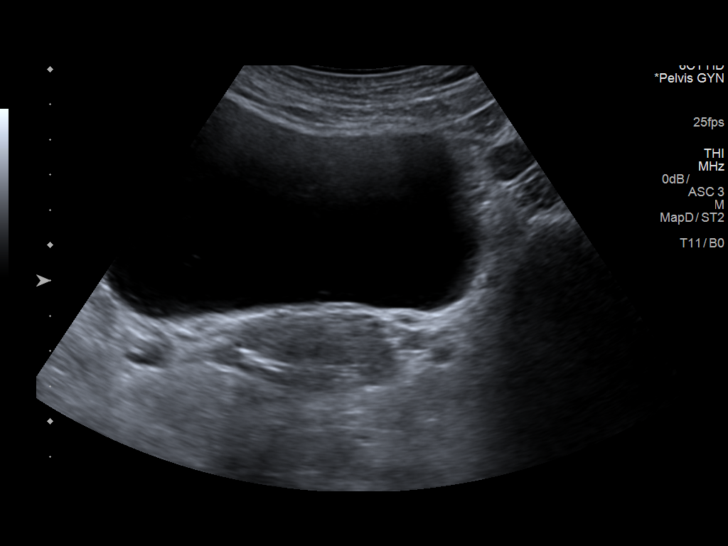
[im 18/70]
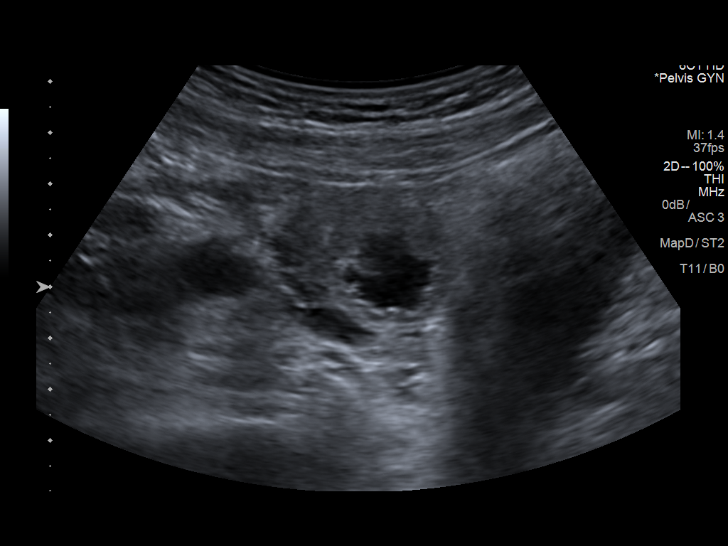
[im 24/70]
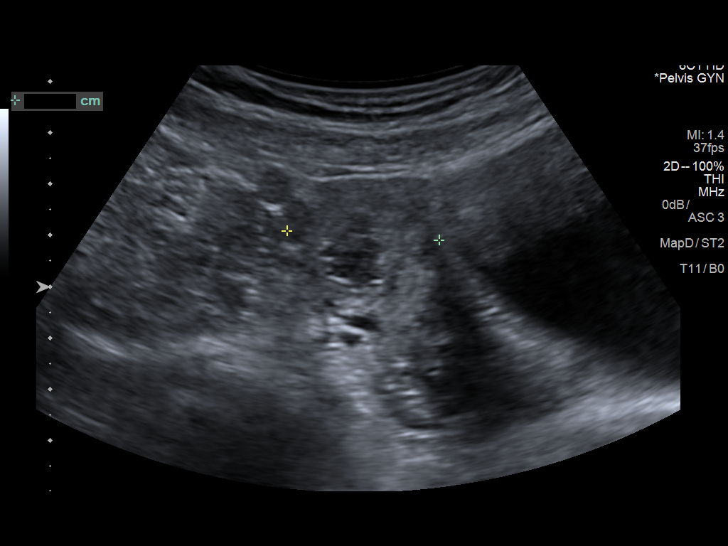
[im 29/70]
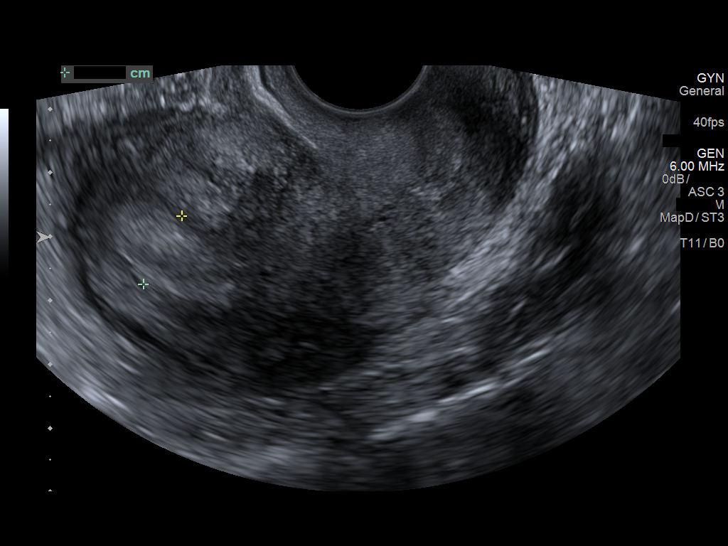
[im 35/70]
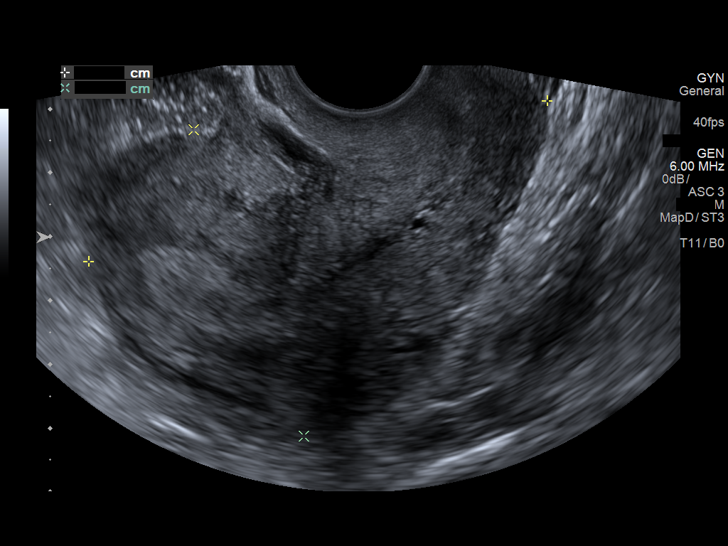
[im 41/70]
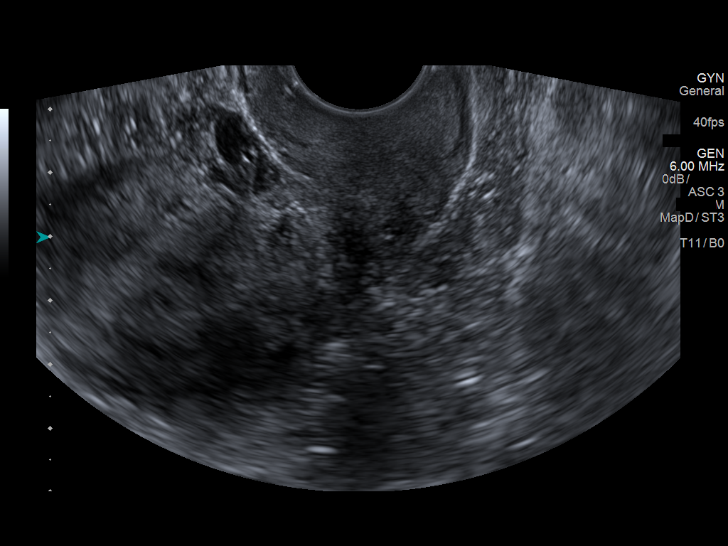
[im 47/70]
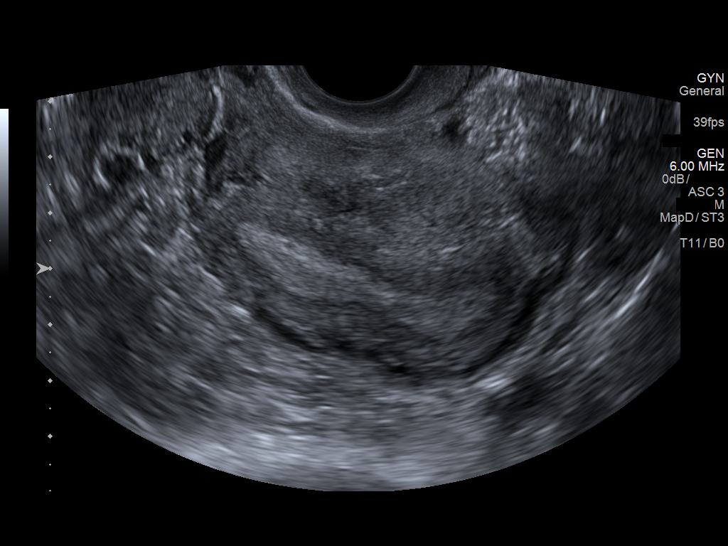
[im 52/70]
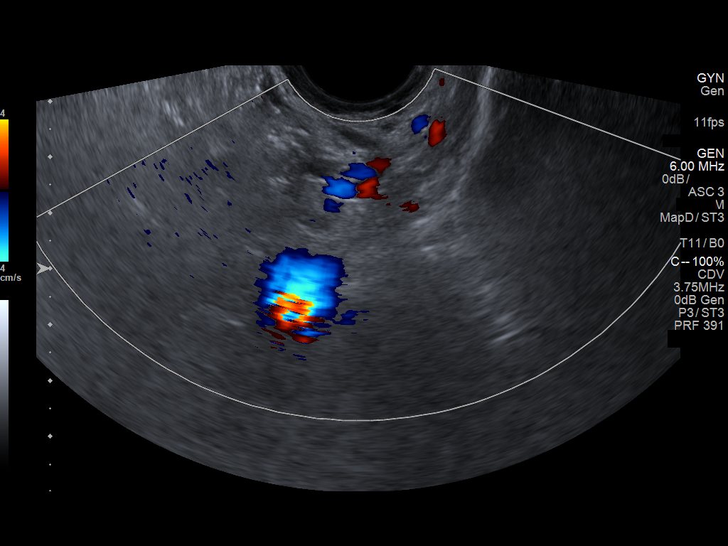
[im 58/70]
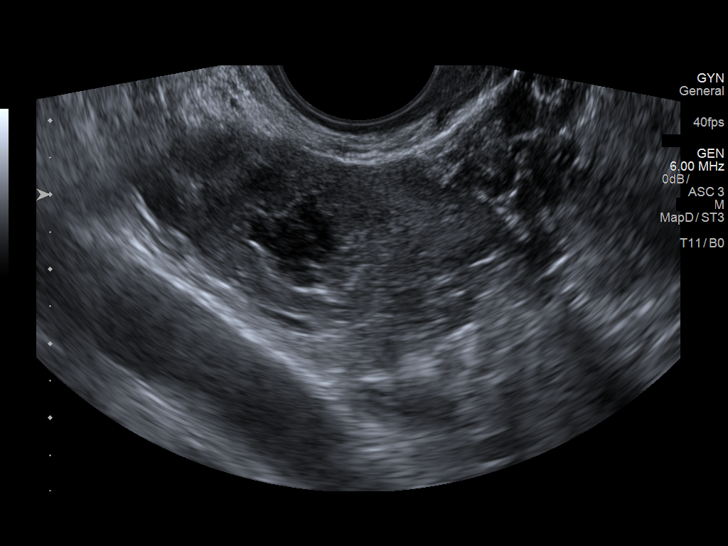
[im 64/70]
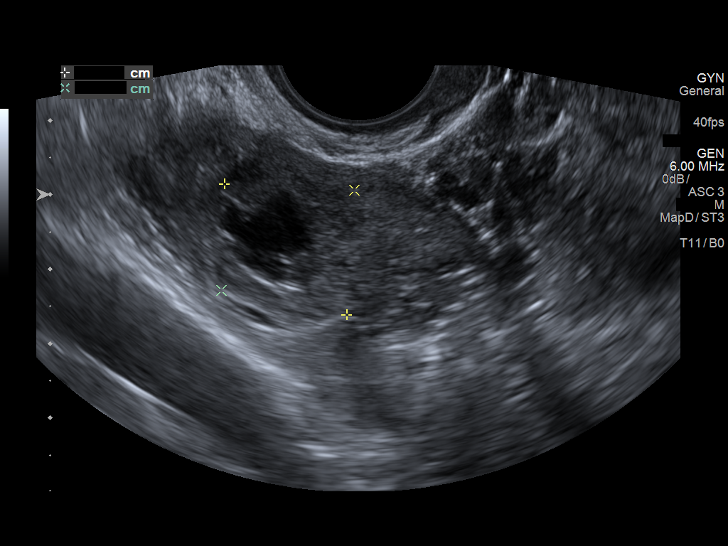
[im 70/70]
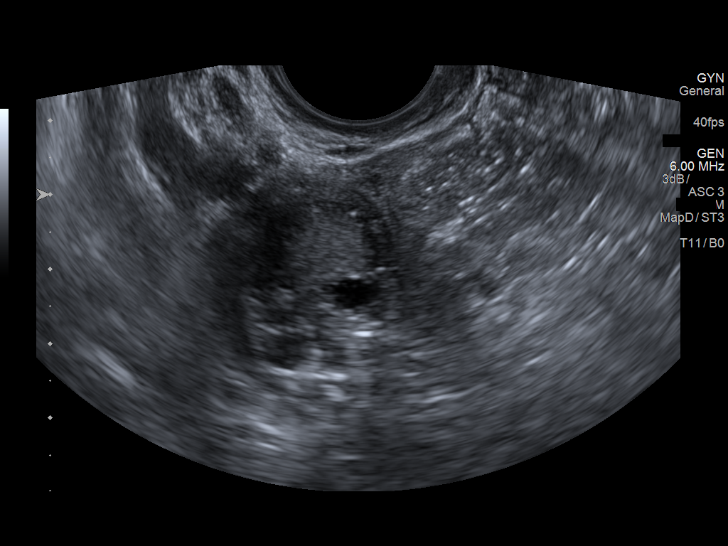

[13 of 25 positions shown; findings below may reference images not displayed]

FINDINGS: Uterus

Measurements: 7.6 x 5.1 x 6.3 cm. No fibroids or other mass
visualized.

Endometrium

Thickness: 12.3 mm.  No focal abnormality visualized.

Right ovary

Measurements: 4.6 x 2.3 x 2.8 cm. 2.4 x 2.2 x 2.1 cm complex cystic
lesion, most compatible with a corpus luteal cyst. No other adnexal
mass.

Left ovary

Not visualized.  No adnexal mass.

Other findings

No abnormal free fluid.
IMPRESSION: 1. 2.4 cm complex right ovarian cyst, most consistent with a normal
physiologic corpus luteal cyst.
2. Nonvisualization of the left ovary.  No adnexal mass.
3. Otherwise unremarkable and normal pelvic ultrasound. No other
acute abnormality.

## 2019-04-15 ENCOUNTER — Other Ambulatory Visit: Payer: Medicaid Other

## 2019-04-15 DIAGNOSIS — K760 Fatty (change of) liver, not elsewhere classified: Secondary | ICD-10-CM | POA: Diagnosis not present

## 2019-04-15 DIAGNOSIS — R748 Abnormal levels of other serum enzymes: Secondary | ICD-10-CM

## 2019-04-16 LAB — HEPATITIS B SURFACE ANTIGEN: Hepatitis B Surface Ag: NONREACTIVE

## 2019-04-16 LAB — HEPATITIS B CORE ANTIBODY, TOTAL: Hep B Core Total Ab: NONREACTIVE

## 2019-04-16 LAB — HEPATITIS C ANTIBODY
Hepatitis C Ab: NONREACTIVE
SIGNAL TO CUT-OFF: 0.01 (ref <1.00)

## 2019-04-16 LAB — HEPATITIS B SURFACE ANTIBODY,QUALITATIVE: Hep B S Ab: NONREACTIVE

## 2019-04-20 ENCOUNTER — Encounter: Payer: Self-pay | Admitting: Nurse Practitioner

## 2019-04-20 ENCOUNTER — Other Ambulatory Visit: Payer: Self-pay

## 2019-04-20 ENCOUNTER — Other Ambulatory Visit (HOSPITAL_COMMUNITY)
Admission: RE | Admit: 2019-04-20 | Discharge: 2019-04-20 | Disposition: A | Discharge status: Home / Self Care | Payer: Medicaid Other | Source: Ambulatory Visit | Attending: Nurse Practitioner | Admitting: Nurse Practitioner

## 2019-04-20 ENCOUNTER — Ambulatory Visit: Payer: Medicaid Other | Attending: Nurse Practitioner | Admitting: Nurse Practitioner

## 2019-04-20 VITALS — BP 98/61 | HR 78 | Temp 99.1°F | Ht 65.0 in | Wt 140.0 lb

## 2019-04-20 DIAGNOSIS — Z124 Encounter for screening for malignant neoplasm of cervix: Secondary | ICD-10-CM | POA: Diagnosis not present

## 2019-04-20 DIAGNOSIS — R7989 Other specified abnormal findings of blood chemistry: Secondary | ICD-10-CM | POA: Diagnosis not present

## 2019-04-20 NOTE — Progress Notes (Signed)
Assessment & Plan:  Laura Warren was seen today for gynecologic exam.  Diagnoses and all orders for this visit:  Encounter for Papanicolaou smear for cervical cancer screening -     WET PREP -     PAP WITH EVERYTHING  Low TSH level -     Thyroid Panel With TSH    Patient has been counseled on age-appropriate routine health concerns for screening and prevention. These are reviewed and up-to-date. Referrals have been placed accordingly. Immunizations are up-to-date or declined.    Subjective:   Chief Complaint  Patient presents with  . Gynecologic Exam    Pt. is here for pap smear.    HPI Laura Warren 38 y.o. female presents to office today for PAP.   Review of Systems  Constitutional: Negative.  Negative for chills, fever, malaise/fatigue and weight loss.  Respiratory: Negative.  Negative for cough, shortness of breath and wheezing.   Cardiovascular: Negative.  Negative for chest pain, orthopnea and leg swelling.  Gastrointestinal: Negative for abdominal pain.  Genitourinary: Negative.  Negative for flank pain.  Skin: Negative.  Negative for rash.  Psychiatric/Behavioral: Negative for suicidal ideas.    Past Medical History:  Diagnosis Date  . Medical history non-contributory     Past Surgical History:  Procedure Laterality Date  . APPENDECTOMY      Family History  Problem Relation Age of Onset  . Cancer Mother     Social History Reviewed with no changes to be made today.   Outpatient Medications Prior to Visit  Medication Sig Dispense Refill  . Acetaminophen (TYLENOL PO) Take by mouth.    . naproxen (NAPROSYN) 500 MG tablet Take 1 tablet (500 mg total) by mouth 2 (two) times daily with a meal. (Patient not taking: Reported on 04/20/2019) 30 tablet 0   No facility-administered medications prior to visit.    No Known Allergies     Objective:    BP 98/61 (BP Location: Right Arm, Patient Position: Sitting, Cuff Size: Normal)   Pulse 78   Temp 99.1 F  (37.3 C) (Oral)   Ht 5\' 5"  (1.651 m)   Wt 140 lb (63.5 kg)   SpO2 99%   BMI 23.30 kg/m  Wt Readings from Last 3 Encounters:  04/20/19 140 lb (63.5 kg)  03/10/19 137 lb 4 oz (62.3 kg)  02/23/19 139 lb (63 kg)    Physical Exam Constitutional:      Appearance: She is well-developed.  HENT:     Head: Normocephalic.  Cardiovascular:     Rate and Rhythm: Normal rate and regular rhythm.     Heart sounds: Normal heart sounds.  Pulmonary:     Effort: Pulmonary effort is normal.     Breath sounds: Normal breath sounds.  Abdominal:     General: Bowel sounds are normal.     Palpations: Abdomen is soft.     Tenderness: There is abdominal tenderness in the suprapubic area.     Hernia: There is no hernia in the left inguinal area.  Genitourinary:    Labia:        Right: No rash, tenderness, lesion or injury.        Left: No rash, tenderness, lesion or injury.      Vagina: Normal. No signs of injury and foreign body. No vaginal discharge, erythema, tenderness or bleeding.     Cervix: Discharge (thick and white) present. No cervical motion tenderness or friability.     Uterus: Not deviated and not enlarged.  Adnexa:        Right: No mass, tenderness or fullness.         Left: No mass, tenderness or fullness.       Rectum: Normal. No external hemorrhoid.  Lymphadenopathy:     Lower Body: No right inguinal adenopathy. No left inguinal adenopathy.  Skin:    General: Skin is warm and dry.  Neurological:     Mental Status: She is alert and oriented to person, place, and time.  Psychiatric:        Behavior: Behavior normal.        Thought Content: Thought content normal.        Judgment: Judgment normal.          Patient has been counseled extensively about nutrition and exercise as well as the importance of adherence with medications and regular follow-up. The patient was given clear instructions to go to ER or return to medical center if symptoms don't improve, worsen or new  problems develop. The patient verbalized understanding.   Follow-up: Return if symptoms worsen or fail to improve.   Claiborne Rigg, FNP-BC [facility] [place], [place] [phone]   04/20/2019, 10:48 AM

## 2019-04-21 LAB — CERVICOVAGINAL ANCILLARY ONLY
Bacterial Vaginitis (gardnerella): POSITIVE — AB
Candida Glabrata: NEGATIVE
Candida Vaginitis: NEGATIVE
Chlamydia: NEGATIVE
Comment: NEGATIVE
Comment: NEGATIVE
Comment: NEGATIVE
Comment: NEGATIVE
Comment: NEGATIVE
Comment: NORMAL
Neisseria Gonorrhea: NEGATIVE
Trichomonas: NEGATIVE

## 2019-04-21 LAB — THYROID PANEL WITH TSH
Free Thyroxine Index: 2.2 (ref 1.2–4.9)
T3 Uptake Ratio: 27 % (ref 24–39)
T4, Total: 8.3 ug/dL (ref 4.5–12.0)
TSH: 2.01 u[IU]/mL (ref 0.450–4.500)

## 2019-04-21 LAB — CYTOLOGY - PAP
Comment: NEGATIVE
Diagnosis: NEGATIVE
High risk HPV: NEGATIVE

## 2019-04-22 ENCOUNTER — Other Ambulatory Visit: Payer: Self-pay | Admitting: Nurse Practitioner

## 2019-04-22 MED ORDER — METRONIDAZOLE 500 MG PO TABS: 500.0000 mg | ORAL_TABLET | Freq: Two times a day (BID) | ORAL | 0 refills | Status: AC

## 2019-04-24 ENCOUNTER — Other Ambulatory Visit: Payer: Self-pay

## 2019-04-24 ENCOUNTER — Ambulatory Visit: Payer: Medicaid Other | Attending: Nurse Practitioner | Admitting: Pharmacist

## 2019-04-24 DIAGNOSIS — Z23 Encounter for immunization: Secondary | ICD-10-CM | POA: Diagnosis not present

## 2019-04-24 NOTE — Progress Notes (Signed)
Patient presents for vaccination against hep b per orders of Zelda. Consent given. Counseling provided. No contraindications exists. Vaccine administered without incident.   Patient should return in 1 month for 2nd dose and in 6 months for her third and final dose.

## 2019-05-25 ENCOUNTER — Ambulatory Visit: Payer: Medicaid Other | Admitting: Pharmacist

## 2019-05-29 ENCOUNTER — Other Ambulatory Visit: Payer: Self-pay

## 2019-05-29 ENCOUNTER — Ambulatory Visit: Payer: Medicaid Other | Attending: Nurse Practitioner | Admitting: Pharmacist

## 2019-05-29 DIAGNOSIS — Z23 Encounter for immunization: Secondary | ICD-10-CM

## 2019-05-29 NOTE — Progress Notes (Signed)
Patient presents for vaccination against hep b per orders of Zelda. Consent given. Counseling provided. No contraindications exists. Vaccine administered without incident.   Patient should return in June for her final dose.

## 2019-07-08 DIAGNOSIS — Z23 Encounter for immunization: Secondary | ICD-10-CM | POA: Diagnosis not present

## 2019-08-05 DIAGNOSIS — Z23 Encounter for immunization: Secondary | ICD-10-CM | POA: Diagnosis not present

## 2019-11-04 ENCOUNTER — Other Ambulatory Visit (HOSPITAL_COMMUNITY)
Admission: RE | Admit: 2019-11-04 | Discharge: 2019-11-04 | Disposition: A | Discharge status: Home / Self Care | Payer: Medicaid Other | Source: Ambulatory Visit | Attending: Nurse Practitioner | Admitting: Nurse Practitioner

## 2019-11-04 ENCOUNTER — Ambulatory Visit: Payer: Medicaid Other | Attending: Nurse Practitioner | Admitting: Nurse Practitioner

## 2019-11-04 ENCOUNTER — Other Ambulatory Visit: Payer: Self-pay

## 2019-11-04 ENCOUNTER — Encounter: Payer: Self-pay | Admitting: Nurse Practitioner

## 2019-11-04 VITALS — BP 104/68 | HR 70 | Temp 97.2°F | Resp 17 | Wt 140.0 lb

## 2019-11-04 DIAGNOSIS — Z13220 Encounter for screening for lipoid disorders: Secondary | ICD-10-CM | POA: Diagnosis not present

## 2019-11-04 DIAGNOSIS — R102 Pelvic and perineal pain: Secondary | ICD-10-CM | POA: Insufficient documentation

## 2019-11-04 DIAGNOSIS — R399 Unspecified symptoms and signs involving the genitourinary system: Secondary | ICD-10-CM | POA: Diagnosis not present

## 2019-11-04 DIAGNOSIS — R8290 Unspecified abnormal findings in urine: Secondary | ICD-10-CM | POA: Diagnosis not present

## 2019-11-04 DIAGNOSIS — N390 Urinary tract infection, site not specified: Secondary | ICD-10-CM | POA: Diagnosis not present

## 2019-11-04 DIAGNOSIS — Z79899 Other long term (current) drug therapy: Secondary | ICD-10-CM | POA: Insufficient documentation

## 2019-11-04 DIAGNOSIS — Z791 Long term (current) use of non-steroidal anti-inflammatories (NSAID): Secondary | ICD-10-CM | POA: Diagnosis not present

## 2019-11-04 LAB — POCT URINALYSIS DIP (CLINITEK)
Bilirubin, UA: NEGATIVE
Glucose, UA: NEGATIVE mg/dL
Ketones, POC UA: NEGATIVE mg/dL
Nitrite, UA: NEGATIVE
POC PROTEIN,UA: NEGATIVE
Spec Grav, UA: 1.02 (ref 1.010–1.025)
Urobilinogen, UA: 0.2 E.U./dL
pH, UA: 7 (ref 5.0–8.0)

## 2019-11-04 MED ORDER — SULFAMETHOXAZOLE-TRIMETHOPRIM 800-160 MG PO TABS: 1.0000 | ORAL_TABLET | Freq: Two times a day (BID) | ORAL | 0 refills | Status: AC

## 2019-11-04 NOTE — Progress Notes (Signed)
Assessment & Plan:  Laura Warren was seen today for urinary tract infection.  Diagnoses and all orders for this visit:  UTI symptoms -     POCT URINALYSIS DIP (CLINITEK) -     Cervicovaginal ancillary only -     sulfamethoxazole-trimethoprim (BACTRIM DS) 800-160 MG tablet; Take 1 tablet by mouth 2 (two) times daily for 3 days.  Pelvic pressure in female -     Cervicovaginal ancillary only -     CBC -     CMP14+EGFR -     sulfamethoxazole-trimethoprim (BACTRIM DS) 800-160 MG tablet; Take 1 tablet by mouth 2 (two) times daily for 3 days.  Abnormal urinalysis -     Urine Culture -     sulfamethoxazole-trimethoprim (BACTRIM DS) 800-160 MG tablet; Take 1 tablet by mouth 2 (two) times daily for 3 days.  Lipid screening -     Lipid panel    Patient has been counseled on age-appropriate routine health concerns for screening and prevention. These are reviewed and up-to-date. Referrals have been placed accordingly. Immunizations are up-to-date or declined.    Subjective:   Chief Complaint  Patient presents with   Urinary Tract Infection    lower abdominal pain, pelvic pressure. denies frequency, pain, urgency   HPI Laura Warren 39 y.o. female presents to office today with complaints of abdominal pain and pelvic pressure.     Urinary Tract Infection: Patient complains of dysuria and suprapubic pressure She has had symptoms for 2 weeks. Patient also complains of vaginal discharge and vaginal itching. Patient denies back pain and fever. Patient does not have a history of recurrent UTI.  Patient does not have a history of pyelonephritis.   Bowel movements are normal. Last menstrual cycle over a week or 2 ago per her report.     Review of Systems  Constitutional: Negative for fever, malaise/fatigue and weight loss.  HENT: Negative.  Negative for nosebleeds.   Eyes: Negative.  Negative for blurred vision, double vision and photophobia.  Respiratory: Negative.  Negative for cough  and shortness of breath.   Cardiovascular: Negative.  Negative for chest pain, palpitations and leg swelling.  Gastrointestinal: Positive for abdominal pain. Negative for blood in stool, constipation, diarrhea, heartburn, melena, nausea and vomiting.  Genitourinary: Negative for frequency, hematuria and urgency.       SEE HPI  Musculoskeletal: Negative.  Negative for myalgias.  Neurological: Negative.  Negative for dizziness, focal weakness, seizures and headaches.  Psychiatric/Behavioral: Negative.  Negative for suicidal ideas.    Past Medical History:  Diagnosis Date   Medical history non-contributory     Past Surgical History:  Procedure Laterality Date   APPENDECTOMY      Family History  Problem Relation Age of Onset   Cancer Mother     Social History Reviewed with no changes to be made today.   Outpatient Medications Prior to Visit  Medication Sig Dispense Refill   Acetaminophen (TYLENOL PO) Take by mouth.     naproxen (NAPROSYN) 500 MG tablet Take 1 tablet (500 mg total) by mouth 2 (two) times daily with a meal. (Patient not taking: Reported on 04/20/2019) 30 tablet 0   No facility-administered medications prior to visit.    No Known Allergies     Objective:    BP 104/68    Pulse 70    Temp (!) 97.2 F (36.2 C) (Temporal)    Resp 17    Wt 140 lb (63.5 kg)    SpO2 97%  BMI 23.30 kg/m  Wt Readings from Last 3 Encounters:  11/04/19 140 lb (63.5 kg)  04/20/19 140 lb (63.5 kg)  03/10/19 137 lb 4 oz (62.3 kg)    Physical Exam Vitals and nursing note reviewed.  Constitutional:      Appearance: She is well-developed.  HENT:     Head: Normocephalic and atraumatic.  Cardiovascular:     Rate and Rhythm: Normal rate and regular rhythm.     Heart sounds: Normal heart sounds. No murmur heard.  No friction rub. No gallop.   Pulmonary:     Effort: Pulmonary effort is normal. No tachypnea or respiratory distress.     Breath sounds: Normal breath sounds. No  decreased breath sounds, wheezing, rhonchi or rales.  Chest:     Chest wall: No tenderness.  Abdominal:     General: Bowel sounds are normal.     Palpations: Abdomen is soft.     Tenderness: There is abdominal tenderness in the suprapubic area. There is no right CVA tenderness or left CVA tenderness.  Musculoskeletal:        General: Normal range of motion.     Cervical back: Normal range of motion.  Skin:    General: Skin is warm and dry.  Neurological:     Mental Status: She is alert and oriented to person, place, and time.     Coordination: Coordination normal.  Psychiatric:        Behavior: Behavior normal. Behavior is cooperative.        Thought Content: Thought content normal.        Judgment: Judgment normal.          Patient has been counseled extensively about nutrition and exercise as well as the importance of adherence with medications and regular follow-up. The patient was given clear instructions to go to ER or return to medical center if symptoms don't improve, worsen or new problems develop. The patient verbalized understanding.   Follow-up: No follow-ups on file.   Gildardo Pounds, FNP-BC [facility], [place]   11/04/2019, 4:35 PM

## 2019-11-05 LAB — LIPID PANEL
Chol/HDL Ratio: 3.6 ratio (ref 0.0–4.4)
Cholesterol, Total: 168 mg/dL (ref 100–199)
HDL: 47 mg/dL (ref >39)
LDL Chol Calc (NIH): 108 mg/dL — ABNORMAL HIGH (ref 0–99)
Triglycerides: 69 mg/dL (ref 0–149)
VLDL Cholesterol Cal: 13 mg/dL (ref 5–40)

## 2019-11-05 LAB — CBC
Hematocrit: 40.9 % (ref 34.0–46.6)
Hemoglobin: 13.2 g/dL (ref 11.1–15.9)
MCH: 28.9 pg (ref 26.6–33.0)
MCHC: 32.3 g/dL (ref 31.5–35.7)
MCV: 90 fL (ref 79–97)
Platelets: 160 10*3/uL (ref 150–450)
RBC: 4.56 x10E6/uL (ref 3.77–5.28)
RDW: 12.2 % (ref 11.7–15.4)
WBC: 5.4 10*3/uL (ref 3.4–10.8)

## 2019-11-05 LAB — CMP14+EGFR
ALT: 11 IU/L (ref 0–32)
AST: 14 IU/L (ref 0–40)
Albumin/Globulin Ratio: 1.9 (ref 1.2–2.2)
Albumin: 4.3 g/dL (ref 3.8–4.8)
Alkaline Phosphatase: 32 IU/L — ABNORMAL LOW (ref 48–121)
BUN/Creatinine Ratio: 19 (ref 9–23)
BUN: 13 mg/dL (ref 6–20)
Bilirubin Total: 0.7 mg/dL (ref 0.0–1.2)
CO2: 26 mmol/L (ref 20–29)
Calcium: 9 mg/dL (ref 8.7–10.2)
Chloride: 105 mmol/L (ref 96–106)
Creatinine, Ser: 0.7 mg/dL (ref 0.57–1.00)
GFR calc Af Amer: 126 mL/min/{1.73_m2} (ref >59)
GFR calc non Af Amer: 109 mL/min/{1.73_m2} (ref >59)
Globulin, Total: 2.3 g/dL (ref 1.5–4.5)
Glucose: 102 mg/dL — ABNORMAL HIGH (ref 65–99)
Potassium: 3.8 mmol/L (ref 3.5–5.2)
Sodium: 140 mmol/L (ref 134–144)
Total Protein: 6.6 g/dL (ref 6.0–8.5)

## 2019-11-06 LAB — CERVICOVAGINAL ANCILLARY ONLY
Bacterial Vaginitis (gardnerella): POSITIVE — AB
Candida Glabrata: NEGATIVE
Candida Vaginitis: NEGATIVE
Chlamydia: NEGATIVE
Comment: NEGATIVE
Comment: NEGATIVE
Comment: NEGATIVE
Comment: NEGATIVE
Comment: NEGATIVE
Comment: NORMAL
Neisseria Gonorrhea: NEGATIVE
Trichomonas: NEGATIVE

## 2019-11-06 LAB — URINE CULTURE: Organism ID, Bacteria: NO GROWTH

## 2019-11-12 ENCOUNTER — Other Ambulatory Visit: Payer: Self-pay | Admitting: Nurse Practitioner

## 2019-11-12 MED ORDER — METRONIDAZOLE 500 MG PO TABS: 500.0000 mg | ORAL_TABLET | Freq: Two times a day (BID) | ORAL | 0 refills | Status: AC

## 2019-11-26 ENCOUNTER — Encounter: Payer: Self-pay | Admitting: Internal Medicine

## 2019-11-26 ENCOUNTER — Other Ambulatory Visit: Payer: Self-pay

## 2019-11-26 ENCOUNTER — Ambulatory Visit: Payer: Medicaid Other | Attending: Internal Medicine | Admitting: Internal Medicine

## 2019-11-26 VITALS — BP 104/68 | HR 75 | Resp 16 | Wt 139.2 lb

## 2019-11-26 DIAGNOSIS — R002 Palpitations: Secondary | ICD-10-CM | POA: Diagnosis not present

## 2019-11-26 DIAGNOSIS — W503XXA Accidental bite by another person, initial encounter: Secondary | ICD-10-CM

## 2019-11-26 MED ORDER — BUSPIRONE HCL 5 MG PO TABS: 5.0000 mg | ORAL_TABLET | Freq: Two times a day (BID) | ORAL | 0 refills | Status: DC
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
## 2019-11-27 ENCOUNTER — Telehealth: Payer: Self-pay

## 2019-11-27 LAB — TSH: TSH: 1.29 u[IU]/mL (ref 0.450–4.500)

## 2019-11-27 NOTE — Telephone Encounter (Signed)
Contacted pt to go over lab results pt didn't answer lvm asking pt to give a call back her convenience

## 2019-12-09 ENCOUNTER — Other Ambulatory Visit (HOSPITAL_COMMUNITY)
Admission: RE | Admit: 2019-12-09 | Discharge: 2019-12-09 | Disposition: A | Discharge status: Home / Self Care | Payer: Medicaid Other | Source: Ambulatory Visit | Attending: Critical Care Medicine | Admitting: Critical Care Medicine

## 2019-12-09 ENCOUNTER — Encounter: Payer: Self-pay | Admitting: Critical Care Medicine

## 2019-12-09 ENCOUNTER — Ambulatory Visit: Payer: Medicaid Other | Attending: Critical Care Medicine | Admitting: Critical Care Medicine

## 2019-12-09 ENCOUNTER — Other Ambulatory Visit: Payer: Self-pay

## 2019-12-09 VITALS — BP 108/78 | HR 68 | Temp 98.0°F | Resp 16 | Ht 65.0 in | Wt 139.0 lb

## 2019-12-09 DIAGNOSIS — B9689 Other specified bacterial agents as the cause of diseases classified elsewhere: Secondary | ICD-10-CM | POA: Insufficient documentation

## 2019-12-09 DIAGNOSIS — N760 Acute vaginitis: Secondary | ICD-10-CM

## 2019-12-09 LAB — POCT URINALYSIS DIP (CLINITEK)
Bilirubin, UA: NEGATIVE
Glucose, UA: NEGATIVE mg/dL
Ketones, POC UA: NEGATIVE mg/dL
Nitrite, UA: NEGATIVE
POC PROTEIN,UA: NEGATIVE
Spec Grav, UA: 1.02 (ref 1.010–1.025)
Urobilinogen, UA: 0.2 E.U./dL
pH, UA: 7 (ref 5.0–8.0)

## 2019-12-09 NOTE — Progress Notes (Signed)
Subjective:    Patient ID: Laura Warren, female    DOB: [birth year], 39 y.o.   MRN: [id number]  This is a 39 year old female primary care patient of Bertram Denver.  The patient comes in with recurrent pain with urination this has been coming and going for 1 year.  She does have a prior history of urinary tract infections.  She notes when she urinates she will have pain in the suprapubic  area and also have nausea.  The patient notes symptoms are worse at night.  She is not seen a urologist.  She now has Medicaid.  She works as a Retail buyer for homebound patients.  She also notes shortness of breath at night.  She was treated with BuSpar for this with is not affected her nighttime symptoms.  The patient denies any current lumbar back pain at this time.  Note she was tested for bacterial vaginosis recently with positive cultures and was given a course of Flagyl.  She stated this actually resolved all the symptoms she has.    Past Medical History:  Diagnosis Date  . Medical history non-contributory      Family History  Problem Relation Age of Onset  . Cancer Mother      Social History   Socioeconomic History  . Marital status: Married    Spouse name: Ayesha Rumpf  . Number of children: 2  . Years of education: Not on file  . Highest education level: Not on file  Occupational History  . Not on file  Tobacco Use  . Smoking status: Never Smoker  . Smokeless tobacco: Never Used  Vaping Use  . Vaping Use: Never used  Substance and Sexual Activity  . Alcohol use: No  . Drug use: Never  . Sexual activity: Yes    Birth control/protection: None  Other Topics Concern  . Not on file  Social History Narrative  . Not on file   Social Determinants of Health   Financial Resource Strain:   . Difficulty of Paying Living Expenses:   Food Insecurity:   . Worried About Programme researcher, broadcasting/film/video in the Last Year:   . Barista in the Last Year:   Transportation Needs:   . Sales promotion account executive (Medical):   Marland Kitchen Lack of Transportation (Non-Medical):   Physical Activity:   . Days of Exercise per Week:   . Minutes of Exercise per Session:   Stress:   . Feeling of Stress :   Social Connections:   . Frequency of Communication with Friends and Family:   . Frequency of Social Gatherings with Friends and Family:   . Attends Religious Services:   . Active Member of Clubs or Organizations:   . Attends Banker Meetings:   Marland Kitchen Marital Status:   Intimate Partner Violence:   . Fear of Current or Ex-Partner:   . Emotionally Abused:   Marland Kitchen Physically Abused:   . Sexually Abused:      No Known Allergies   Outpatient Medications Prior to Visit  Medication Sig Dispense Refill  . busPIRone (BUSPAR) 5 MG tablet Take 1 tablet (5 mg total) by mouth 2 (two) times daily. (Patient not taking: Reported on 12/09/2019) 60 tablet 0   No facility-administered medications prior to visit.      Review of Systems  Constitutional: Negative.   HENT: Negative.   Eyes: Negative.   Respiratory: Positive for shortness of breath.   Cardiovascular: Negative.   Gastrointestinal: Positive for  nausea. Negative for abdominal distention, abdominal pain, anal bleeding, blood in stool, diarrhea, rectal pain and vomiting.  Endocrine: Negative.   Genitourinary: Positive for dyspareunia, dysuria, pelvic pain, urgency, vaginal discharge and vaginal pain. Negative for decreased urine volume, difficulty urinating, enuresis, flank pain, frequency, genital sores, hematuria, menstrual problem and vaginal bleeding.  Musculoskeletal: Negative.   Neurological: Negative.   Hematological: Negative.   Psychiatric/Behavioral: Negative.        Objective:   Physical Exam Vitals:   12/09/19 0950  BP: 108/78  Pulse: 68  Resp: 16  Temp: 98 F (36.7 C)  SpO2: 100%  Weight: 139 lb (63 kg)  Height: 5\' 5"  (1.651 m)    Gen: Pleasant, well-nourished, in no distress,  normal affect  ENT: No  lesions,  mouth clear,  oropharynx clear, no postnasal drip  Neck: No JVD, no TMG, no carotid bruits  Lungs: No use of accessory muscles, no dullness to percussion, clear without rales or rhonchi  Cardiovascular: RRR, heart sounds normal, no murmur or gallops, no peripheral edema  Abdomen: soft and NT, no HSM,  BS normal  Musculoskeletal: No deformities, no cyanosis or clubbing  Neuro: alert, non focal  Skin: Warm, no lesions or rashes  No results found.        Assessment & Plan:  I personally reviewed all images and lab data in the [facility] system as well as any outside material available during this office visit and agree with the  radiology impressions.   No problem-specific Assessment & Plan notes found for this encounter.   Diagnoses and all orders for this visit:  Bacterial vaginosis -     Cervicovaginal ancillary only -     Ambulatory referral to Gynecology -     POCT URINALYSIS DIP (CLINITEK)

## 2019-12-09 NOTE — Assessment & Plan Note (Signed)
Recurrent bacterial vaginosis likely  Plan here will be to obtain cervical vaginal swab and follow-up results depending on this further antibiotics may be necessary  We will also have this patient establish with gynecology for further follow-up

## 2019-12-09 NOTE — Patient Instructions (Signed)
A vaginal swab was obtained  A referral to gynecology was sent , they will call for appoinment  We will call you the result   Bacterial Vaginosis  Bacterial vaginosis is an infection of the vagina. It happens when too many normal germs (healthy bacteria) grow in the vagina. This infection puts you at risk for infections from sex (STIs). Treating this infection can lower your risk for some STIs. You should also treat this if you are pregnant. It can cause your baby to be born early. Follow these instructions at home: Medicines  Take over-the-counter and prescription medicines only as told by your doctor.  Take or use your antibiotic medicine as told by your doctor. Do not stop taking or using it even if you start to feel better. General instructions  If you your sexual partner is a woman, tell her that you have this infection. She needs to get treatment if she has symptoms. If you have a female partner, he does not need to be treated.  During treatment: ? Avoid sex. ? Do not douche. ? Avoid alcohol as told. ? Avoid breastfeeding as told.  Drink enough fluid to keep your pee (urine) clear or pale yellow.  Keep your vagina and butt (rectum) clean. ? Wash the area with warm water every day. ? Wipe from front to back after you use the toilet.  Keep all follow-up visits as told by your doctor. This is important. Preventing this condition  Do not douche.  Use only warm water to wash around your vagina.  Use protection when you have sex. This includes: ? Latex condoms. ? Dental dams.  Limit how many people you have sex with. It is best to only have sex with the same person (be monogamous).  Get tested for STIs. Have your partner get tested.  Wear underwear that is cotton or lined with cotton.  Avoid tight pants and pantyhose. This is most important in summer.  Do not use any products that have nicotine or tobacco in them. These include cigarettes and e-cigarettes. If you need  help quitting, ask your doctor.  Do not use illegal drugs.  Limit how much alcohol you drink. Contact a doctor if:  Your symptoms do not get better, even after you are treated.  You have more discharge or pain when you pee (urinate).  You have a fever.  You have pain in your belly (abdomen).  You have pain with sex.  Your bleed from your vagina between periods. Summary  This infection happens when too many germs (bacteria) grow in the vagina.  Treating this condition can lower your risk for some infections from sex (STIs).  You should also treat this if you are pregnant. It can cause early (premature) birth.  Do not stop taking or using your antibiotic medicine even if you start to feel better. This information is not intended to replace advice given to you by your health care provider. Make sure you discuss any questions you have with your health care provider. Document Revised: 04/05/2017 Document Reviewed: 01/07/2016 Elsevier Patient Education  2020 [employer].

## 2019-12-09 NOTE — Progress Notes (Signed)
C /o mid pelvic pain and discomfort when  urination

## 2019-12-10 LAB — CERVICOVAGINAL ANCILLARY ONLY
Bacterial Vaginitis (gardnerella): NEGATIVE
Candida Glabrata: NEGATIVE
Candida Vaginitis: NEGATIVE
Chlamydia: NEGATIVE
Comment: NEGATIVE
Comment: NEGATIVE
Comment: NEGATIVE
Comment: NEGATIVE
Comment: NEGATIVE
Comment: NORMAL
Neisseria Gonorrhea: NEGATIVE
Trichomonas: NEGATIVE

## 2020-01-06 ENCOUNTER — Ambulatory Visit: Payer: Medicaid Other | Admitting: Nurse Practitioner

## 2020-01-08 ENCOUNTER — Ambulatory Visit: Payer: Medicaid Other | Admitting: Nurse Practitioner

## 2020-01-08 ENCOUNTER — Other Ambulatory Visit: Payer: Self-pay

## 2020-02-03 ENCOUNTER — Ambulatory Visit (INDEPENDENT_AMBULATORY_CARE_PROVIDER_SITE_OTHER): Payer: Medicaid Other | Admitting: Obstetrics & Gynecology

## 2020-02-03 ENCOUNTER — Other Ambulatory Visit: Payer: Self-pay

## 2020-02-03 ENCOUNTER — Encounter: Payer: Self-pay | Admitting: Obstetrics & Gynecology

## 2020-02-03 VITALS — BP 99/71 | HR 71 | Ht 65.0 in | Wt 134.1 lb

## 2020-02-03 DIAGNOSIS — B9689 Other specified bacterial agents as the cause of diseases classified elsewhere: Secondary | ICD-10-CM

## 2020-02-03 DIAGNOSIS — N760 Acute vaginitis: Secondary | ICD-10-CM | POA: Diagnosis not present

## 2020-02-03 NOTE — Patient Instructions (Signed)

## 2020-02-03 NOTE — Progress Notes (Signed)
Referred her for recurrent BV. Denies symptoms now.  Kearie Mennen,RN

## 2020-02-03 NOTE — Progress Notes (Signed)
Laura Warren is an 39 y.o. female. Who presents with recurrent BV. Pt denies symptoms today.  She notes that BV normally occurs at end of menses. Flagyl has helped. She has associated dysuria that is intermittent denies currently. Has had chronic UTIs in past not on suppressive abx.   Pertinent Gynecological History: Menses: flow is light Bleeding: none Contraception: none DES exposure: denies Blood transfusions: none Sexually transmitted diseases: no past history OB History: G2, P2002  Menstrual History: Menarche age: 73 Patient's last menstrual period was 01/14/2020. Period Cycle (Days):  (26-30) Period Duration (Days): 3 Period Pattern: Regular Menstrual Flow: Light  Past Medical History:  Diagnosis Date  . Medical history non-contributory     Past Surgical History:  Procedure Laterality Date  . APPENDECTOMY      Family History  Problem Relation Age of Onset  . Cancer Mother     Social History:  reports that she has never smoked. She has never used smokeless tobacco. She reports current alcohol use. She reports that she does not use drugs.  Allergies: No Known Allergies  (Not in a hospital admission)   Review of Systems  Constitutional: Negative for activity change, appetite change, fatigue and unexpected weight change.  HENT: Negative.   Eyes: Negative.   Respiratory: Negative.  Negative for chest tightness, shortness of breath and wheezing.   Cardiovascular: Negative.  Negative for chest pain and leg swelling.  Gastrointestinal: Negative.  Negative for abdominal distention, abdominal pain, constipation, diarrhea, nausea and vomiting.  Endocrine: Negative.   Genitourinary: Negative.  Negative for dysuria and hematuria.  Neurological: Negative.  Negative for dizziness, weakness, light-headedness, numbness and headaches.  Hematological: Negative.   Psychiatric/Behavioral: Negative.  Negative for agitation, confusion and decreased concentration.    Blood  pressure 99/71, pulse 71, height 5\' 5"  (1.651 m), weight 134 lb 1.6 oz (60.8 kg), last menstrual period 01/14/2020. Physical Exam Vitals reviewed.  Constitutional:      Appearance: She is well-developed.  HENT:     Head: Normocephalic and atraumatic.  Eyes:     Pupils: Pupils are equal, round, and reactive to light.  Cardiovascular:     Rate and Rhythm: Normal rate.  Pulmonary:     Effort: Pulmonary effort is normal.  Abdominal:     General: There is no distension.     Palpations: Abdomen is soft.     Tenderness: There is no abdominal tenderness. There is no guarding.  Musculoskeletal:     Cervical back: Normal range of motion.  Skin:    General: Skin is warm and dry.  Neurological:     Mental Status: She is alert and oriented to person, place, and time.  Psychiatric:        Behavior: Behavior normal.     Assessment/Plan: 39yo G2P2 w/ h/o recurrent BV and chronic UTI.  Discussed pH balance w/ repHresh, vagasil, summers eve products that are restorative to pH. If recurrent and flagyl ineffective or if she preventives therapy instead of treatment of frequent episodes of BV - oral metronidazole 500 mg twice daily or oral tinidazole 1 gram once daily for seven days; vaginal boric acid 600 mg is begun at the same time and continued for 21 days. Boric acid can cause death if consumed orally. Patients are seen for follow-up a day or two after their last vaginal boric acid dose; if they are in remission, we immediately begin metronidazole gel twice weekly for four to six months as suppressive therapy. Instructed pt to make nurse visit  next time w/ dysuria and we can perform UA w/ reflex urine culture.  Pt questions answered, follow up prn concerns.   Malachy Chamber 02/03/2020, 2:09 PM

## 2020-03-21 IMAGING — CT CT RENAL STONE PROTOCOL
2 of 4 series · 17 of 46 positions shown, 19 images · non-contrast
Comparison: Pelvis ultrasound 12/03/2017 and earlier.

CLINICAL DATA: 38-year-old female with right side abdominal pain.
Reports prior appendectomy.

EXAM:
CT ABDOMEN AND PELVIS WITHOUT CONTRAST
TECHNIQUE: Multidetector CT imaging of the abdomen and pelvis was performed
following the standard protocol without IV contrast.

[Series 3: ap without · axial · non-contrast · 0.68mm/px · z∈[+806,+1216]mm · 14 of 92 slices shown, 16 images]
[im 5/92  soft-tissue]
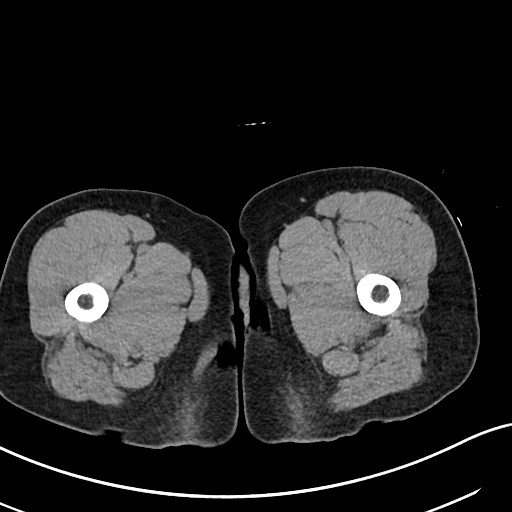
[im 5/92  bone]
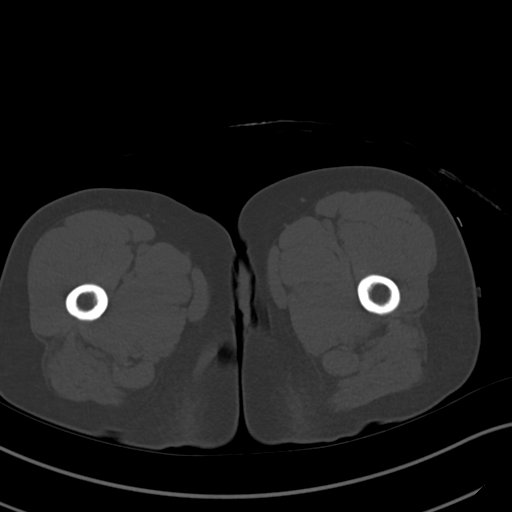
[im 10/92  soft-tissue]
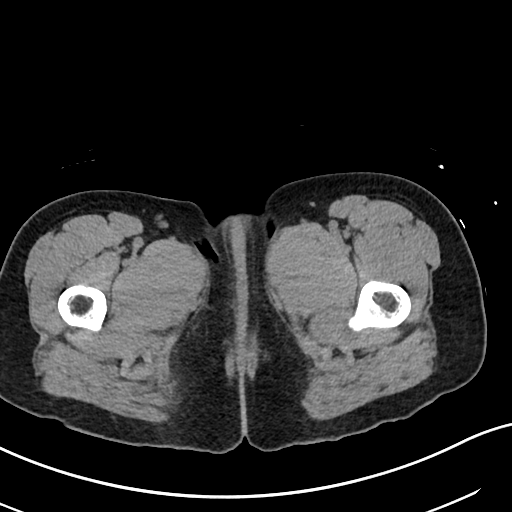
[im 20/92  soft-tissue]
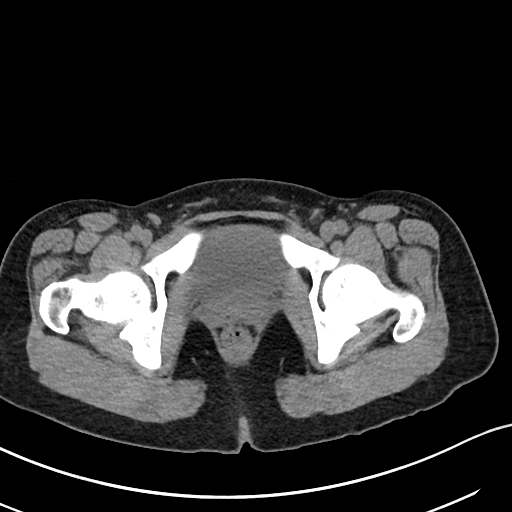
[im 24/92  soft-tissue]
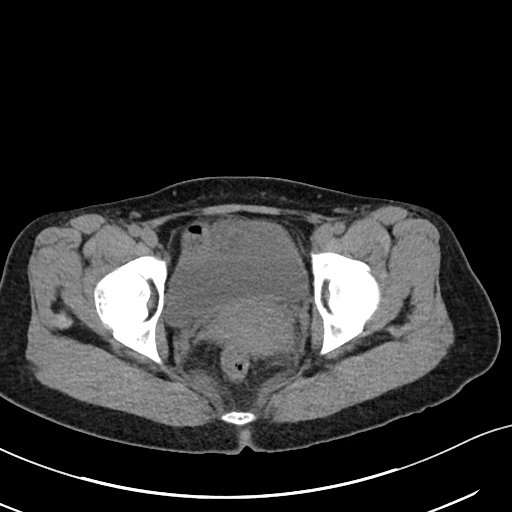
[im 29/92  soft-tissue]
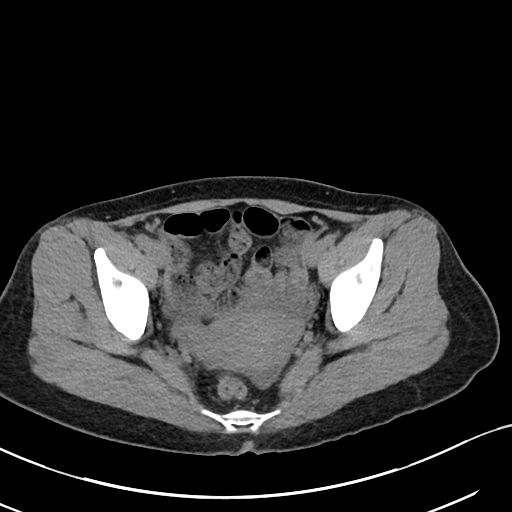
[im 39/92  soft-tissue]
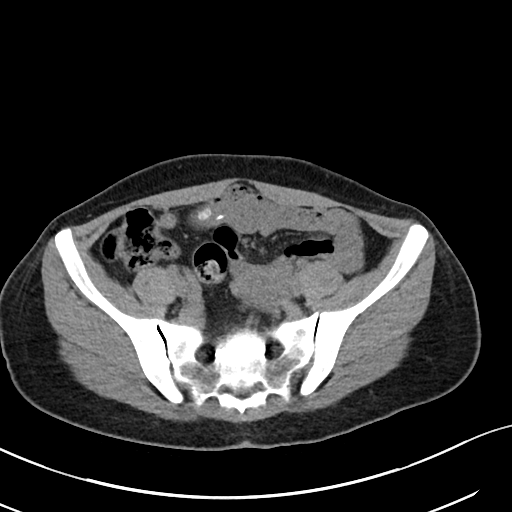
[im 44/92  soft-tissue]
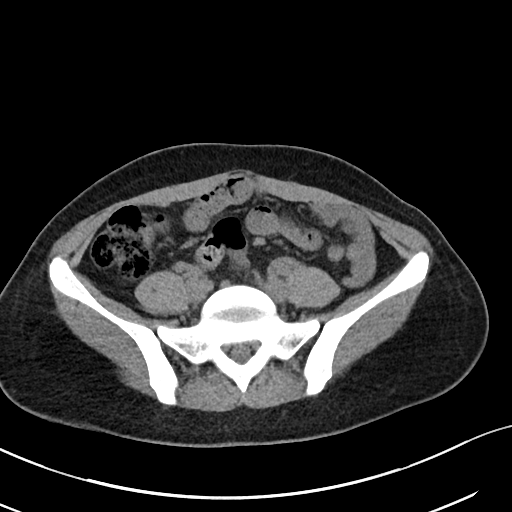
[im 48/92  soft-tissue]
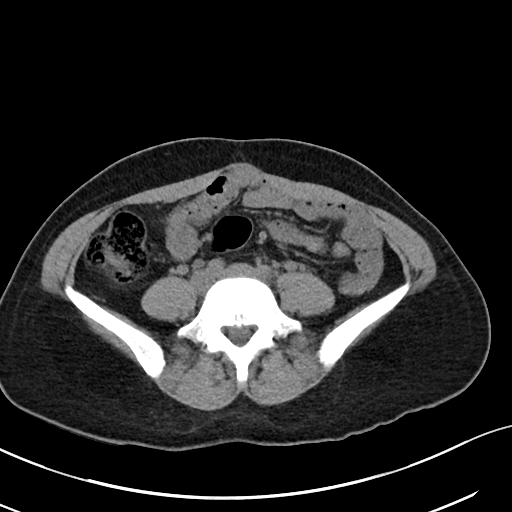
[im 53/92  soft-tissue]
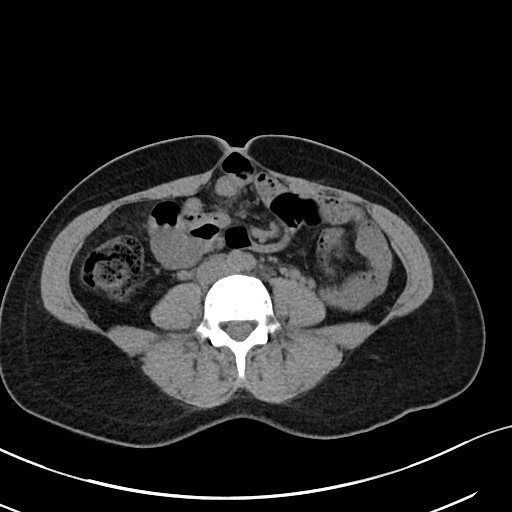
[im 53/92  bone]
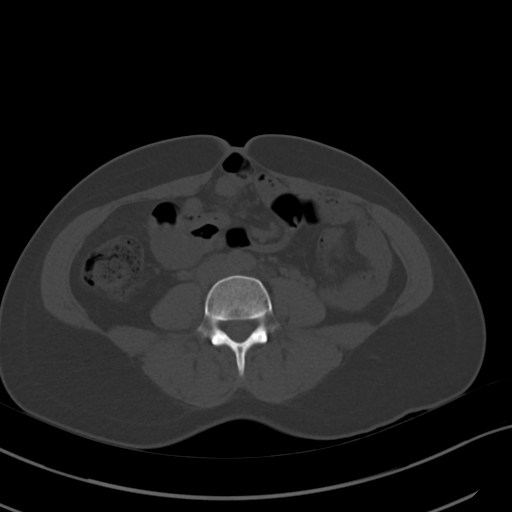
[im 63/92  soft-tissue]
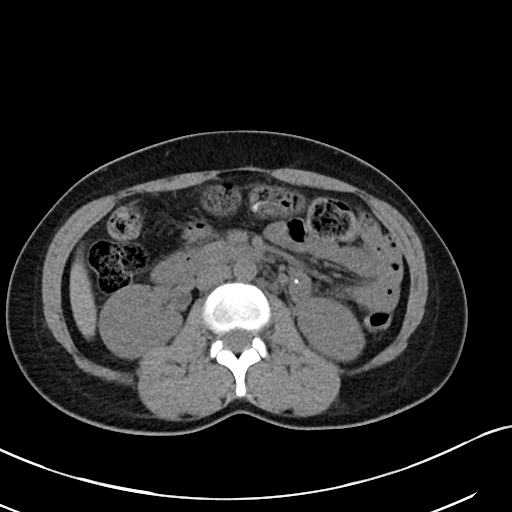
[im 68/92  soft-tissue]
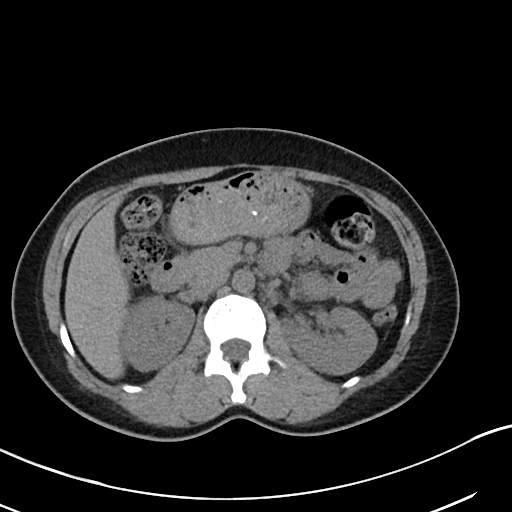
[im 72/92  soft-tissue]
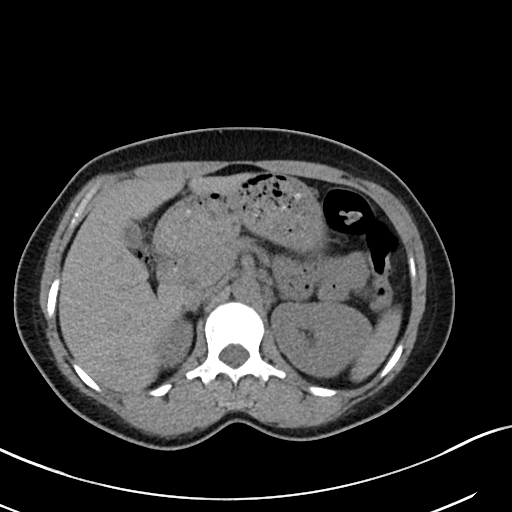
[im 82/92  soft-tissue]
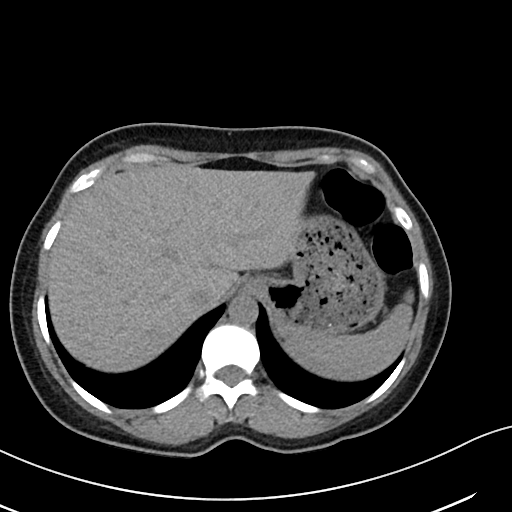
[im 87/92  soft-tissue]
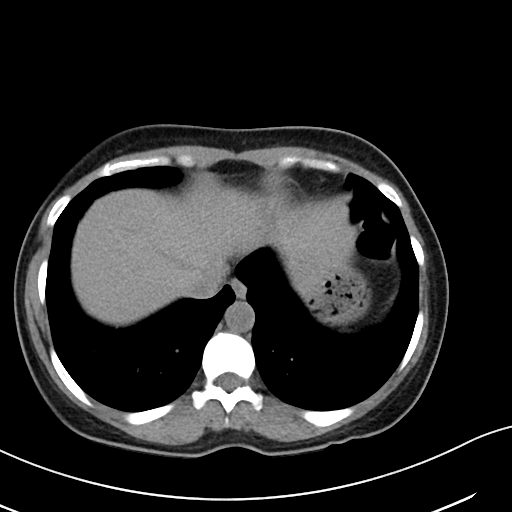

[Series 6: cor · coronal · 0.86mm/px · 3 of 81 slices shown]
[im 27/81  soft-tissue]
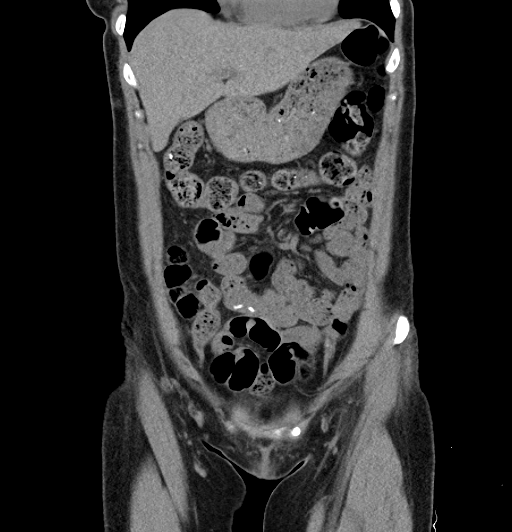
[im 36/81  soft-tissue]
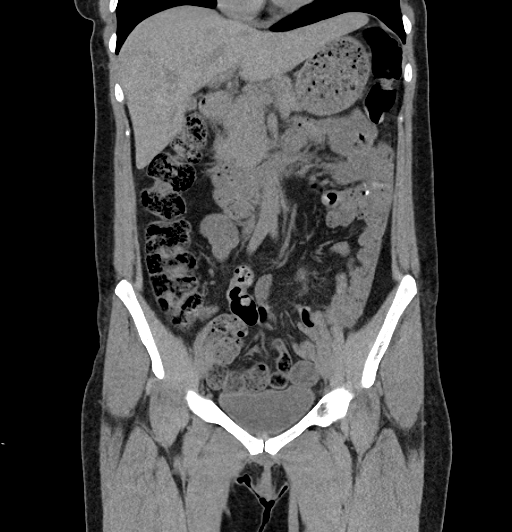
[im 45/81  soft-tissue]
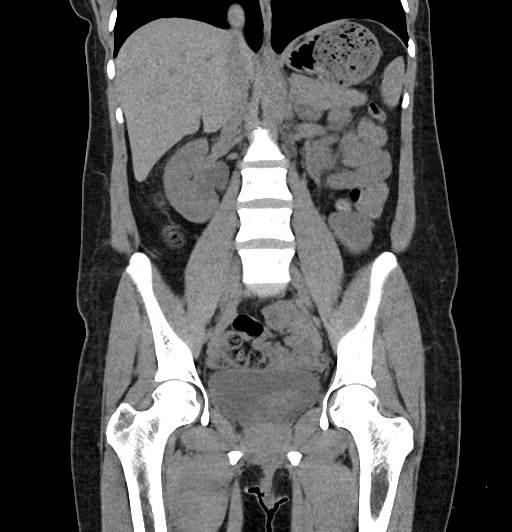

[17 of 46 positions shown; findings below may reference images not displayed]

FINDINGS: Lower chest: Negative.

Hepatobiliary: Negative noncontrast liver and gallbladder. The
gallbladder is contracted.

Pancreas: Negative.

Spleen: Negative.

Adrenals/Urinary Tract: Normal adrenal glands.

No nephrolithiasis. Negative noncontrast left kidney and proximal
left ureter.

The right renal pelvis is asymmetrically larger, but there is no
convincing right hydronephrosis and the proximal right ureter
appears normal. No calculus identified along the course of the right
ureter. Unremarkable urinary bladder.

Stomach/Bowel: Negative rectosigmoid colon, redundant sigmoid.
Negative descending colon. Mild retained stool in the transverse and
right colon. Appendix not identified. No pericecal inflammation.
Negative terminal ileum.

Flocculated material in the distal small bowel, but no dilated small
bowel loops. Unremarkable stomach. No mesenteric stranding. No free
air, free fluid.

Vascular/Lymphatic: Vascular patency is not evaluated in the absence
of IV contrast.

No lymphadenopathy.

Reproductive: Negative noncontrast appearance.

Other: No pelvic free fluid.

Musculoskeletal: Negative.
IMPRESSION: Negative noncontrast CT Abdomen and Pelvis.

## 2020-04-06 ENCOUNTER — Emergency Department (HOSPITAL_COMMUNITY)
Admission: EM | Admit: 2020-04-06 | Discharge: 2020-04-07 | Disposition: A | Discharge status: Home / Self Care | Payer: Medicaid Other | Attending: Emergency Medicine | Admitting: Emergency Medicine

## 2020-04-06 ENCOUNTER — Other Ambulatory Visit: Payer: Self-pay

## 2020-04-06 DIAGNOSIS — B9689 Other specified bacterial agents as the cause of diseases classified elsewhere: Secondary | ICD-10-CM | POA: Insufficient documentation

## 2020-04-06 DIAGNOSIS — N83202 Unspecified ovarian cyst, left side: Secondary | ICD-10-CM | POA: Diagnosis not present

## 2020-04-06 DIAGNOSIS — R1031 Right lower quadrant pain: Secondary | ICD-10-CM | POA: Diagnosis present

## 2020-04-06 DIAGNOSIS — R112 Nausea with vomiting, unspecified: Secondary | ICD-10-CM | POA: Insufficient documentation

## 2020-04-06 DIAGNOSIS — N760 Acute vaginitis: Secondary | ICD-10-CM | POA: Diagnosis not present

## 2020-04-06 LAB — COMPREHENSIVE METABOLIC PANEL
ALT: 15 U/L (ref 0–44)
AST: 19 U/L (ref 15–41)
Albumin: 4.1 g/dL (ref 3.5–5.0)
Alkaline Phosphatase: 31 U/L — ABNORMAL LOW (ref 38–126)
Anion gap: 11 (ref 5–15)
BUN: 9 mg/dL (ref 6–20)
CO2: 27 mmol/L (ref 22–32)
Calcium: 9.3 mg/dL (ref 8.9–10.3)
Chloride: 102 mmol/L (ref 98–111)
Creatinine, Ser: 0.83 mg/dL (ref 0.44–1.00)
GFR, Estimated: >60 mL/min (ref >60)
Glucose, Bld: 99 mg/dL (ref 70–99)
Potassium: 3.8 mmol/L (ref 3.5–5.1)
Sodium: 140 mmol/L (ref 135–145)
Total Bilirubin: 0.7 mg/dL (ref 0.3–1.2)
Total Protein: 7.1 g/dL (ref 6.5–8.1)

## 2020-04-06 LAB — URINALYSIS, ROUTINE W REFLEX MICROSCOPIC
Bilirubin Urine: NEGATIVE
Glucose, UA: NEGATIVE mg/dL
Ketones, ur: NEGATIVE mg/dL
Nitrite: NEGATIVE
Protein, ur: NEGATIVE mg/dL
Specific Gravity, Urine: 1.013 (ref 1.005–1.030)
pH: 6 (ref 5.0–8.0)

## 2020-04-06 LAB — CBC
HCT: 40.4 % (ref 36.0–46.0)
Hemoglobin: 12.8 g/dL (ref 12.0–15.0)
MCH: 28.6 pg (ref 26.0–34.0)
MCHC: 31.7 g/dL (ref 30.0–36.0)
MCV: 90.2 fL (ref 80.0–100.0)
Platelets: 157 10*3/uL (ref 150–400)
RBC: 4.48 MIL/uL (ref 3.87–5.11)
RDW: 12.4 % (ref 11.5–15.5)
WBC: 5.1 10*3/uL (ref 4.0–10.5)
nRBC: 0 % (ref 0.0–0.2)

## 2020-04-06 LAB — I-STAT BETA HCG BLOOD, ED (MC, WL, AP ONLY): I-stat hCG, quantitative: <5 m[IU]/mL (ref <5)

## 2020-04-06 LAB — LIPASE, BLOOD: Lipase: 44 U/L (ref 11–51)

## 2020-04-06 NOTE — ED Triage Notes (Signed)
Pt here from home for eval of RLQ abdominal pain and n/v x 2 days.

## 2020-04-07 ENCOUNTER — Emergency Department (HOSPITAL_COMMUNITY): Payer: Medicaid Other

## 2020-04-07 DIAGNOSIS — N83202 Unspecified ovarian cyst, left side: Secondary | ICD-10-CM | POA: Diagnosis not present

## 2020-04-07 LAB — WET PREP, GENITAL
Sperm: NONE SEEN
Trich, Wet Prep: NONE SEEN
Yeast Wet Prep HPF POC: NONE SEEN

## 2020-04-07 LAB — GC/CHLAMYDIA PROBE AMP (~~LOC~~) NOT AT ARMC
Chlamydia: NEGATIVE
Comment: NEGATIVE
Comment: NORMAL
Neisseria Gonorrhea: NEGATIVE

## 2020-04-07 MED ORDER — KETOROLAC TROMETHAMINE 30 MG/ML IJ SOLN
30.0000 mg | Freq: Once | INTRAMUSCULAR | Status: AC
  Administered 2020-04-07: 30 mg via INTRAMUSCULAR

## 2020-04-07 MED ORDER — METRONIDAZOLE 500 MG PO TABS: 500.0000 mg | ORAL_TABLET | Freq: Two times a day (BID) | ORAL | 0 refills | Status: AC

## 2020-04-07 MED ORDER — KETOROLAC TROMETHAMINE 30 MG/ML IJ SOLN
30.0000 mg | Freq: Once | INTRAMUSCULAR | Status: DC
  Filled 2020-04-07: qty 1

## 2020-04-07 MED ORDER — ONDANSETRON 4 MG PO TBDP
4.0000 mg | ORAL_TABLET | Freq: Once | ORAL | Status: AC
  Administered 2020-04-07: 4 mg via ORAL
  Filled 2020-04-07: qty 1

## 2020-04-07 NOTE — ED Provider Notes (Signed)
[facility] EMERGENCY DEPARTMENT Provider Note   CSN: [id number] Arrival date & time: 04/06/20  1734     History Chief Complaint  Patient presents with  . Abdominal Pain    Laura Warren is a 39 y.o. female.  HPI   39 year old female presenting to the emergency department today for evaluation of right lower quadrant abdominal pain.  States pain is constant for the last 2 days.  Describes as a cramping pain.  It is associated with nausea and some vomiting.  She denies any diarrhea, constipation, dysuria, frequency, urgency, vaginal discharge.  She is currently on her menstrual cycle.  She denies any associated fevers.  States she has a history of appendectomy about 15 years ago.  Past Medical History:  Diagnosis Date  . Medical history non-contributory     Patient Active Problem List   Diagnosis Date Noted  . Bacterial vaginosis 12/09/2019    Past Surgical History:  Procedure Laterality Date  . APPENDECTOMY       OB History    Gravida  2   Para  2   Term  2   Preterm  0   AB  0   Living  2     SAB  0   TAB  0   Ectopic  0   Multiple  0   Live Births  2           Family History  Problem Relation Age of Onset  . Cancer Mother     Social History   Tobacco Use  . Smoking status: Never Smoker  . Smokeless tobacco: Never Used  Vaping Use  . Vaping Use: Never used  Substance Use Topics  . Alcohol use: Yes    Comment: rarely  . Drug use: Never    Home Medications Prior to Admission medications   Medication Sig Start Date End Date Taking? Authorizing Provider  busPIRone (BUSPAR) 5 MG tablet Take 1 tablet (5 mg total) by mouth 2 (two) times daily. Patient not taking: Reported on 12/09/2019 11/26/19   Marcine Matar, MD  metroNIDAZOLE (FLAGYL) 500 MG tablet Take 1 tablet (500 mg total) by mouth 2 (two) times daily for 10 days. 04/07/20 04/17/20  Elmond Poehlman S, PA-C    Allergies    Patient has no known  allergies.  Review of Systems   Review of Systems  Constitutional: Negative for fever.  HENT: Negative for ear pain and sore throat.   Eyes: Negative for visual disturbance.  Respiratory: Negative for cough and shortness of breath.   Cardiovascular: Negative for chest pain.  Gastrointestinal: Positive for abdominal pain, nausea and vomiting. Negative for constipation and diarrhea.  Genitourinary: Negative for dysuria and hematuria.  Musculoskeletal: Negative for back pain.  Skin: Negative for rash.  Neurological: Negative for headaches.  All other systems reviewed and are negative.   Physical Exam Updated Vital Signs BP 101/71   Pulse (!) 56   Temp (!) 97.1 F (36.2 C) (Oral)   Resp 16   Ht 5\' 5"  (1.651 m)   Wt 62.6 kg   SpO2 98%   BMI 22.96 kg/m   Physical Exam Vitals and nursing note reviewed.  Constitutional:      General: She is not in acute distress.    Appearance: She is well-developed.  HENT:     Head: Normocephalic and atraumatic.  Eyes:     Conjunctiva/sclera: Conjunctivae normal.  Cardiovascular:     Rate and Rhythm: Normal rate and  regular rhythm.     Heart sounds: No murmur heard.   Pulmonary:     Effort: Pulmonary effort is normal. No respiratory distress.     Breath sounds: Normal breath sounds.  Abdominal:     Palpations: Abdomen is soft.     Tenderness: There is abdominal tenderness in the right lower quadrant. There is guarding. There is no right CVA tenderness, left CVA tenderness or rebound.  Genitourinary:    Comments: Exam performed by Karrie Meres,  exam chaperoned Date: 04/07/2020 Pelvic exam: normal external genitalia without evidence of trauma. VULVA: normal appearing vulva with no masses, tenderness or lesion. VAGINA: normal appearing vagina with normal color, no lesions. Copious yellow discharge CERVIX: normal appearing cervix without lesions, cervical motion tenderness absent, cervical os closed with out purulent discharge;  vaginal discharge - copious yellow discharge, Wet prep and DNA probe for chlamydia and GC obtained.   ADNEXA: normal adnexa in size, and no masses. Right adnexal TTP UTERUS: uterus is normal size, shape, consistency and nontender.   Musculoskeletal:     Cervical back: Neck supple.  Skin:    General: Skin is warm and dry.  Neurological:     Mental Status: She is alert.     ED Results / Procedures / Treatments   Labs (all labs ordered are listed, but only abnormal results are displayed) Labs Reviewed  WET PREP, GENITAL - Abnormal; Notable for the following components:      Result Value   Clue Cells Wet Prep HPF POC PRESENT (*)    WBC, Wet Prep HPF POC MANY (*)    All other components within normal limits  COMPREHENSIVE METABOLIC PANEL - Abnormal; Notable for the following components:   Alkaline Phosphatase 31 (*)    All other components within normal limits  URINALYSIS, ROUTINE W REFLEX MICROSCOPIC - Abnormal; Notable for the following components:   Hgb urine dipstick MODERATE (*)    Leukocytes,Ua TRACE (*)    Bacteria, UA RARE (*)    All other components within normal limits  LIPASE, BLOOD  CBC  I-STAT BETA HCG BLOOD, ED ([facility], [facility], [facility] ONLY)  GC/CHLAMYDIA PROBE AMP ([place]) NOT AT [facility]    EKG None  Radiology US Pelvis Complete  Result Date: 04/07/2020 CLINICAL DATA:  Right adnexal tenderness. History of right ovarian cyst. EXAM: TRANSABDOMINAL ULTRASOUND OF PELVIS DOPPLER ULTRASOUND OF OVARIES TECHNIQUE: Transabdominal ultrasound examination of the pelvis was performed including evaluation of the uterus, ovaries, adnexal regions, and pelvic cul-de-sac. Patient refused transvaginal exam. Color and duplex Doppler ultrasound was utilized to evaluate blood flow to the ovaries. COMPARISON:  CT 09/24/2018.  Ultrasound 12/03/2017. FINDINGS: Uterus Measurements: 8.9 x 3.0 x 6.5 cm = volume: 91.2 mL. No focal abnormality identified. Endometrium Thickness: 5.8 mm.  No focal  abnormality visualized. Right ovary Measurements: 2.2 x 1.6 x 2.1 cm = volume: 3.7 mL. Normal appearance/no adnexal mass. Left ovary Measurements: 2.4 x 1.7 x 1.8 cm = volume: 3.8 mL. 1.6 x 1.3 x 1.0 simple cyst. Pulsed Doppler evaluation demonstrates normal low-resistance arterial and venous waveforms in both ovaries. Other: None. IMPRESSION: No acute or focal abnormality identified. 1.6 cm simple left ovarian cyst. No evidence of ovarian torsion. Electronically Signed   By: Maisie Fus  Register   On: 04/07/2020 05:25   [place] Art/Ven Flow Abd Pelv Doppler  Result Date: 04/07/2020 CLINICAL DATA:  Right adnexal tenderness. History of right ovarian cyst. EXAM: TRANSABDOMINAL ULTRASOUND OF PELVIS DOPPLER ULTRASOUND OF OVARIES TECHNIQUE: Transabdominal ultrasound examination  of the pelvis was performed including evaluation of the uterus, ovaries, adnexal regions, and pelvic cul-de-sac. Patient refused transvaginal exam. Color and duplex Doppler ultrasound was utilized to evaluate blood flow to the ovaries. COMPARISON:  CT 09/24/2018.  Ultrasound 12/03/2017. FINDINGS: Uterus Measurements: 8.9 x 3.0 x 6.5 cm = volume: 91.2 mL. No focal abnormality identified. Endometrium Thickness: 5.8 mm.  No focal abnormality visualized. Right ovary Measurements: 2.2 x 1.6 x 2.1 cm = volume: 3.7 mL. Normal appearance/no adnexal mass. Left ovary Measurements: 2.4 x 1.7 x 1.8 cm = volume: 3.8 mL. 1.6 x 1.3 x 1.0 simple cyst. Pulsed Doppler evaluation demonstrates normal low-resistance arterial and venous waveforms in both ovaries. Other: None. IMPRESSION: No acute or focal abnormality identified. 1.6 cm simple left ovarian cyst. No evidence of ovarian torsion. Electronically Signed   By: Maisie Fus  Register   On: 04/07/2020 05:25    Procedures Procedures (including critical care time)  Medications Ordered in ED Medications  ondansetron (ZOFRAN-ODT) disintegrating tablet 4 mg (4 mg Oral Given 04/07/20 0428)  ketorolac (TORADOL) 30 MG/ML  injection 30 mg (30 mg Intramuscular Given 04/07/20 0420)    ED Course  I have reviewed the triage vital signs and the nursing notes.  Pertinent labs & imaging results that were available during my care of the patient were reviewed by me and considered in my medical decision making (see chart for details).    MDM Rules/Calculators/A&P                          39 y/o F presenting for eval of rlq abd pain.   Reviewed/interpreted labs CBC unremarkable CMP unremakable Lipase neg Beta hcg neg UA with hematuria and 6-10 wbc. Few bacteria. Not consistent with UTI. Wet prep consistent with BV Gc/chlamydia pending  Pelvic US reassuring, left ovarian cyst noted, unlikely contributing to sxs.  Suspect pt may have developed pid from bv. Lower suspicion for std given monogamous relationship with husband. Flagyl given and advised to f/u with ob-gyn. She feels improved after toradol in the ED. Advised on return precautions. She voices understanding of the plan and reasons to return. All questions answered, pt stable for d/c.    Final Clinical Impression(s) / ED Diagnoses Final diagnoses:  Bacterial vaginosis    Rx / DC Orders ED Discharge Orders         Ordered    metroNIDAZOLE (FLAGYL) 500 MG tablet  2 times daily        04/07/20 0539           Karrie Meres, PA-C 04/07/20 0649    Nira Conn, MD 04/07/20 [phone]

## 2020-04-07 NOTE — ED Notes (Signed)
Patient transported to US 

## 2020-04-07 NOTE — Discharge Instructions (Signed)
You were given a prescription for antibiotics for bacterial vaginosis. Please take the antibiotic prescription fully.   You have been tested for HIV, syphilis, chlamydia and gonorrhea.  These results will be available in approximately 3 days and you will be contacted by the hospital if the results are positive. Avoid sexual contact until you are aware of the results, and please inform all sexual partners if you test positive for any of these diseases.  Please follow up with your Ob-GYN within 5-7 days for re-evaluation of your symptoms.   Please return to the emergency department for any new or worsening symptoms.

## 2020-04-08 ENCOUNTER — Telehealth: Payer: Self-pay

## 2020-04-08 NOTE — Telephone Encounter (Signed)
Transition Care Management Follow-up Telephone Call  Date of discharge and from where:04/07/2020 [facility]  How have you been since you were released from the hospital? Doing pretty good. Started taking the antibiotics.   Any questions or concerns? No  Items Reviewed:  Did the pt receive and understand the discharge instructions provided? Yes   Medications obtained and verified? Yes   Other? No   Any new allergies since your discharge? No   Dietary orders reviewed? Yes  Do you have support at home? Yes   Home Care and Equipment/Supplies: Were home health services ordered? not applicable If so, what is the name of the agency? na  Has the agency set up a time to come to the patient's home? not applicable Were any new equipment or medical supplies ordered?  No What is the name of the medical supply agency? na Were you able to get the supplies/equipment? not applicable Do you have any questions related to the use of the equipment or supplies? No  Functional Questionnaire: (I = Independent and D = Dependent) ADLs: I  Bathing/Dressing- I  Meal Prep- I  Eating- I  Maintaining continence- I  Transferring/Ambulation- I   Managing Meds- I  Follow up appointments reviewed:   PCP Hospital f/u appt confirmed? Yes  Scheduled to see Bertram Denver, NP on 04/22/2020  @ 10:50am.  Specialist Hospital f/u appt confirmed? No    Are transportation arrangements needed? No   If their condition worsens, is the pt aware to call PCP or go to the Emergency Dept.? Yes  Was the patient provided with contact information for the PCP's office or ED? Yes  Was to pt encouraged to call back with questions or concerns? Yes

## 2020-04-22 ENCOUNTER — Ambulatory Visit: Payer: Medicaid Other | Attending: Nurse Practitioner | Admitting: Nurse Practitioner

## 2020-04-22 ENCOUNTER — Encounter: Payer: Self-pay | Admitting: Nurse Practitioner

## 2020-04-22 ENCOUNTER — Other Ambulatory Visit: Payer: Self-pay

## 2020-04-22 VITALS — BP 99/66 | HR 69 | Temp 98.7°F | Ht 65.0 in | Wt 135.0 lb

## 2020-04-22 DIAGNOSIS — R1031 Right lower quadrant pain: Secondary | ICD-10-CM | POA: Diagnosis not present

## 2020-04-22 DIAGNOSIS — R102 Pelvic and perineal pain: Secondary | ICD-10-CM

## 2020-04-22 LAB — POCT URINALYSIS DIP (CLINITEK)
Bilirubin, UA: NEGATIVE
Glucose, UA: NEGATIVE mg/dL
Ketones, POC UA: NEGATIVE mg/dL
Leukocytes, UA: NEGATIVE
Nitrite, UA: NEGATIVE
POC PROTEIN,UA: NEGATIVE
Spec Grav, UA: 1.025 (ref 1.010–1.025)
Urobilinogen, UA: 0.2 E.U./dL
pH, UA: 5.5 (ref 5.0–8.0)

## 2020-04-22 MED ORDER — OMEPRAZOLE 20 MG PO CPDR: 20.0000 mg | DELAYED_RELEASE_CAPSULE | Freq: Every day | ORAL | 3 refills | Status: DC

## 2020-04-22 NOTE — Progress Notes (Signed)
Assessment & Plan:  Canyon was seen today for abdominal pain.  Diagnoses and all orders for this visit:  Pelvic pain -     POCT URINALYSIS DIP (CLINITEK) -     Urine Culture -     CT Abdomen Pelvis W Contrast; Future  Right lower quadrant abdominal pain -     omeprazole (PRILOSEC) 20 MG capsule; Take 1 capsule (20 mg total) by mouth daily. -     H. pylori breath test; Future    Patient has been counseled on age-appropriate routine health concerns for screening and prevention. These are reviewed and up-to-date. Referrals have been placed accordingly. Immunizations are up-to-date or declined.    Subjective:   Chief Complaint  Patient presents with  . Abdominal Pain    Pt. Is still having right abdominal pain.    HPI Laura Warren 39 y.o. female presents to office today for hospital follow up and with complaints of abdominal pain today.  She was seen in the emergency room on 04/06/2020 with complaints of right lower quadrant abdominal pain, cramping and associated nausea and vomiting.  She denied any GU symptoms at that time aside from being on her menstrual cycle.  UA was not consistent with UTI.  Wet prep showing BV and pelvic ultrasound only showing a small left ovarian cyst which was not felt to be contributing to her symptoms at that time.  She was treated with Toradol and Flagyl and instructed to follow-up with OB/GYN.   Today she notes persistent burning pain in the right lower quadrant. Pain occurs every day and comes and goes. There is no associated nausea and vomiting. She denies dysuria, hematuria, flank pain, constipation or diarrhea. Onset 3 weeks ago. Menstrual cycles are normal.     Review of Systems  Constitutional: Negative for fever, malaise/fatigue and weight loss.  HENT: Negative.  Negative for nosebleeds.   Eyes: Negative.  Negative for blurred vision, double vision and photophobia.  Respiratory: Negative.  Negative for cough and shortness of breath.    Cardiovascular: Negative.  Negative for chest pain, palpitations and leg swelling.  Gastrointestinal: Positive for abdominal pain. Negative for blood in stool, constipation, diarrhea, heartburn, melena, nausea and vomiting.  Genitourinary: Negative.  Negative for hematuria.  Musculoskeletal: Negative.  Negative for myalgias.  Neurological: Negative.  Negative for dizziness, focal weakness, seizures and headaches.  Psychiatric/Behavioral: Negative.  Negative for suicidal ideas.    Past Medical History:  Diagnosis Date  . Medical history non-contributory     Past Surgical History:  Procedure Laterality Date  . APPENDECTOMY      Family History  Problem Relation Age of Onset  . Cancer Mother     Social History Reviewed with no changes to be made today.   Outpatient Medications Prior to Visit  Medication Sig Dispense Refill  . busPIRone (BUSPAR) 5 MG tablet Take 1 tablet (5 mg total) by mouth 2 (two) times daily. (Patient not taking: No sig reported) 60 tablet 0   No facility-administered medications prior to visit.    No Known Allergies     Objective:    BP 99/66 (BP Location: Right Arm, Patient Position: Sitting, Cuff Size: Normal)   Pulse 69   Temp 98.7 F (37.1 C) (Oral)   Ht 5\' 5"  (1.651 m)   Wt 135 lb (61.2 kg)   LMP 04/03/2020   SpO2 99%   BMI 22.47 kg/m  Wt Readings from Last 3 Encounters:  04/22/20 135 lb (61.2 kg)  04/07/20  138 lb (62.6 kg)  02/03/20 134 lb 1.6 oz (60.8 kg)    Physical Exam Vitals and nursing note reviewed.  Constitutional:      Appearance: She is well-developed and well-nourished.  HENT:     Head: Normocephalic and atraumatic.  Eyes:     Extraocular Movements: EOM normal.  Cardiovascular:     Rate and Rhythm: Normal rate and regular rhythm.     Pulses: Intact distal pulses.     Heart sounds: Normal heart sounds. No murmur heard. No friction rub. No gallop.   Pulmonary:     Effort: Pulmonary effort is normal. No tachypnea or  respiratory distress.     Breath sounds: Normal breath sounds. No decreased breath sounds, wheezing, rhonchi or rales.  Chest:     Chest wall: No tenderness.  Abdominal:     General: Bowel sounds are normal.     Palpations: Abdomen is soft.     Tenderness: There is abdominal tenderness in the right lower quadrant.  Musculoskeletal:        General: No edema. Normal range of motion.     Cervical back: Normal range of motion.  Skin:    General: Skin is warm and dry.  Neurological:     Mental Status: She is alert and oriented to person, place, and time.     Coordination: Coordination normal.  Psychiatric:        Mood and Affect: Mood and affect normal.        Behavior: Behavior normal. Behavior is cooperative.        Thought Content: Thought content normal.        Judgment: Judgment normal.          Patient has been counseled extensively about nutrition and exercise as well as the importance of adherence with medications and regular follow-up. The patient was given clear instructions to go to ER or return to medical center if symptoms don't improve, worsen or new problems develop. The patient verbalized understanding.   Follow-up: Return for labs monday 12-17 9am for Hpylori.   Claiborne Rigg, FNP-BC [facility] [place], [place] [phone]   04/22/2020, 12:57 PM

## 2020-04-24 LAB — URINE CULTURE: Organism ID, Bacteria: NO GROWTH

## 2020-04-25 ENCOUNTER — Other Ambulatory Visit: Payer: Medicaid Other

## 2020-09-13 IMAGING — US US ABDOMEN LIMITED
1 series · 14 of 25 positions shown · non-contrast
Comparison: CT abdomen 09/24/2018

CLINICAL DATA: Low serum alkaline phosphatase level.

EXAM:
ULTRASOUND ABDOMEN LIMITED RIGHT UPPER QUADRANT

[Series 1: us abdomen limited · 0.15mm/px · 14 of 45 slices shown]
[im 1/45]
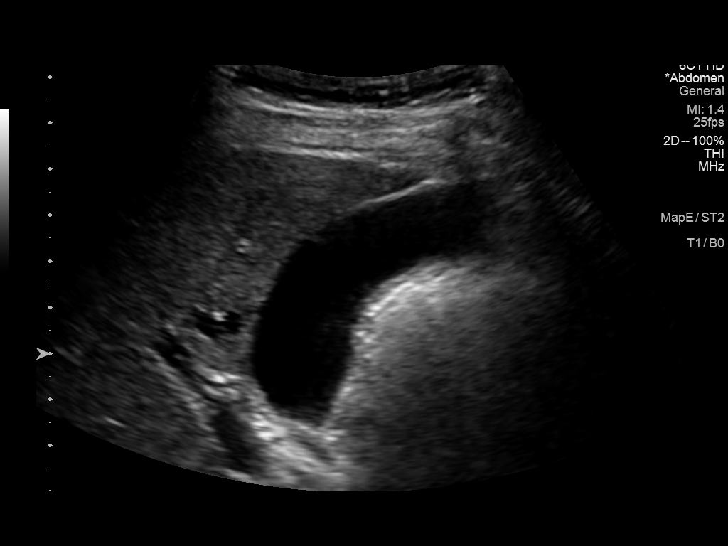
[im 4/45]
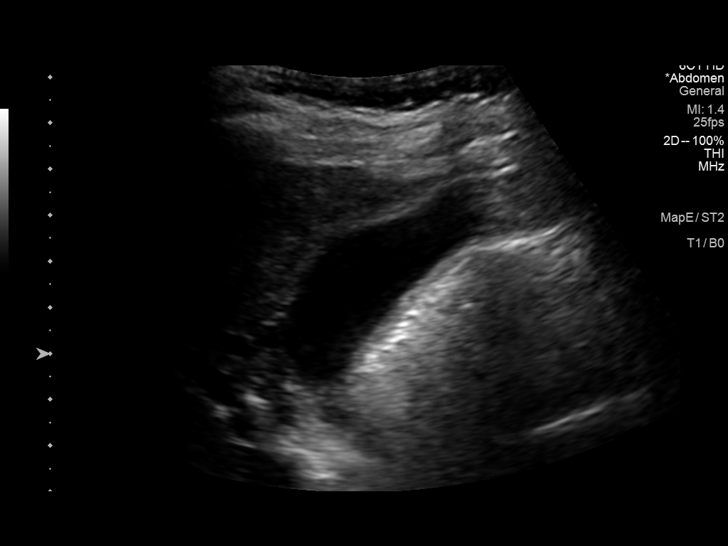
[im 8/45]
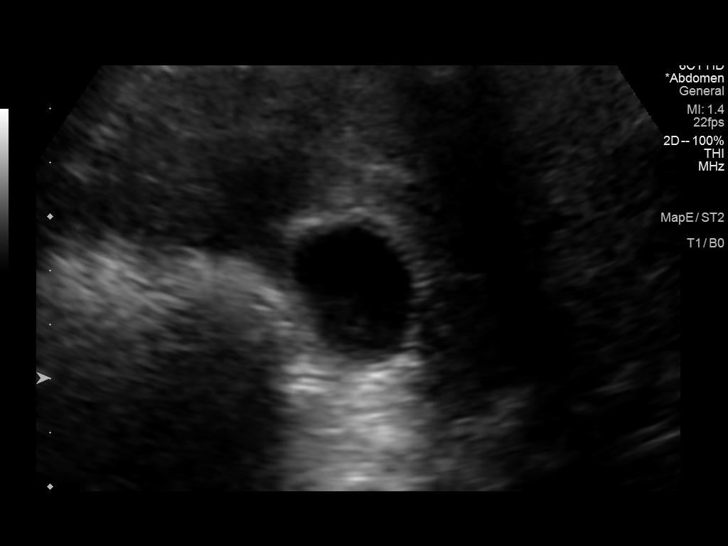
[im 12/45]
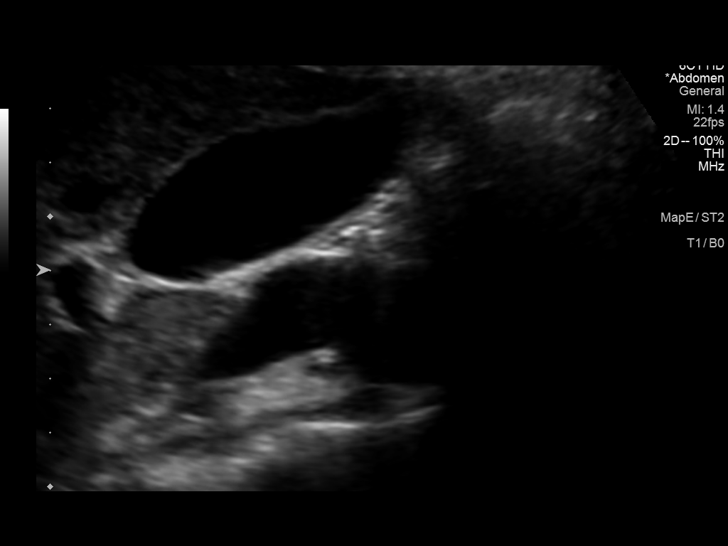
[im 15/45]
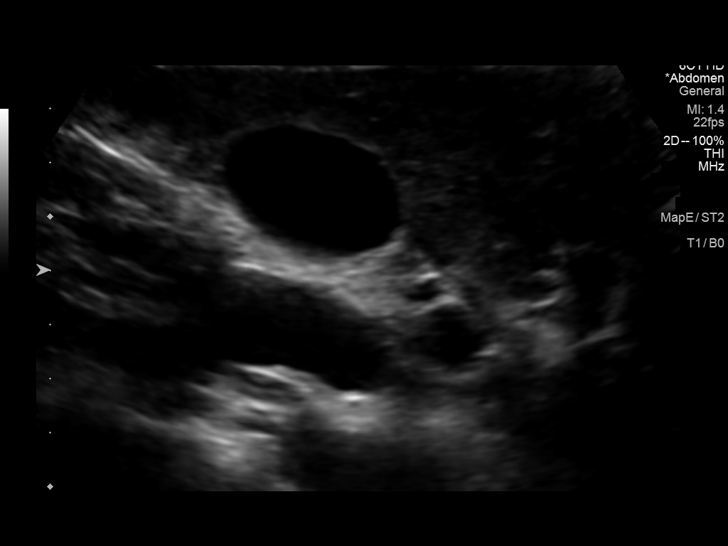
[im 17/45]
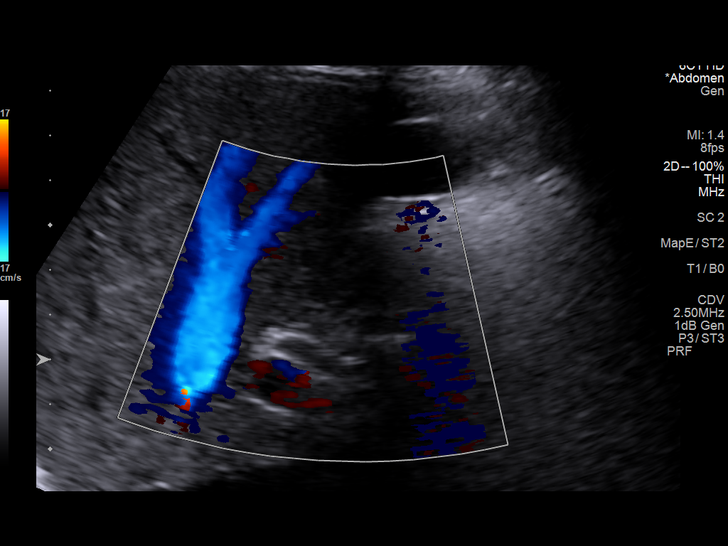
[im 21/45]
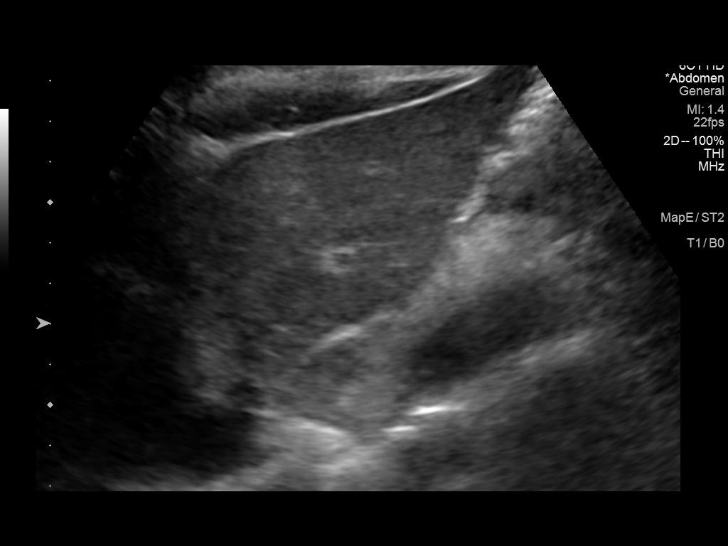
[im 24/45]
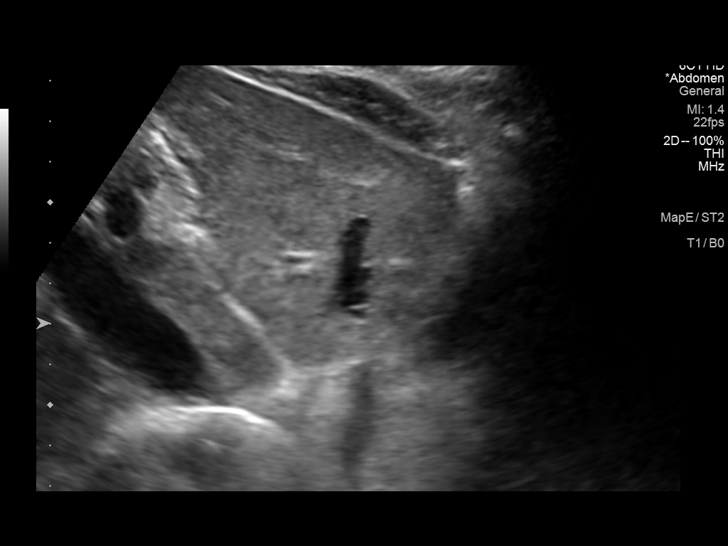
[im 28/45]
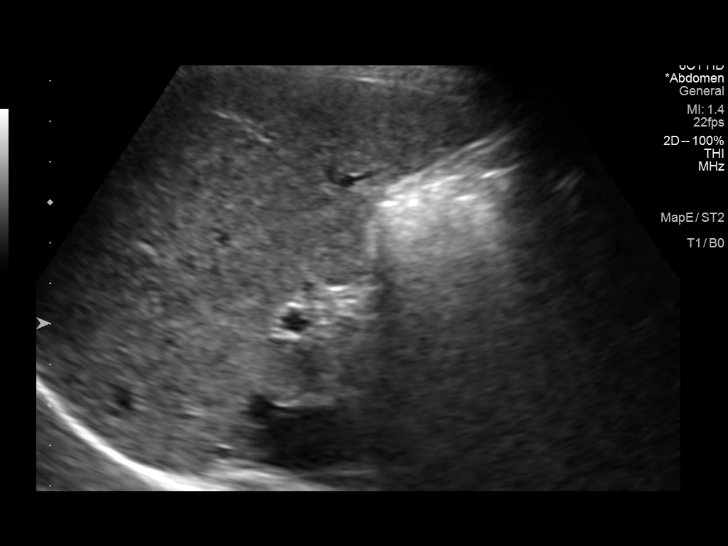
[im 30/45]
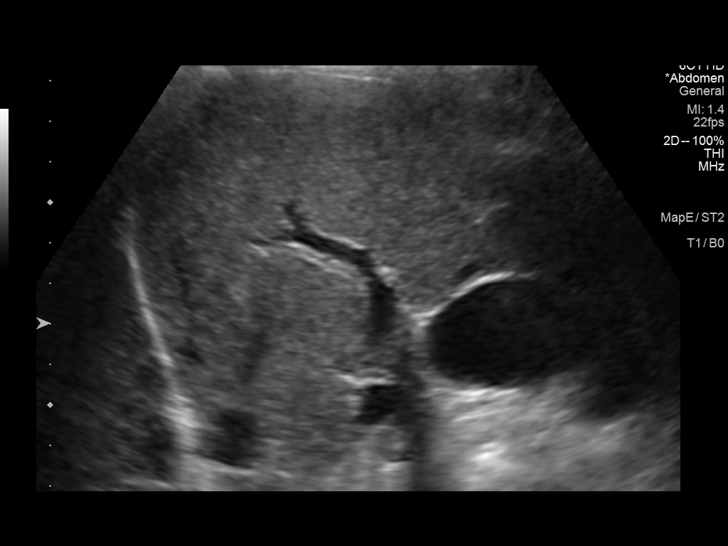
[im 34/45]
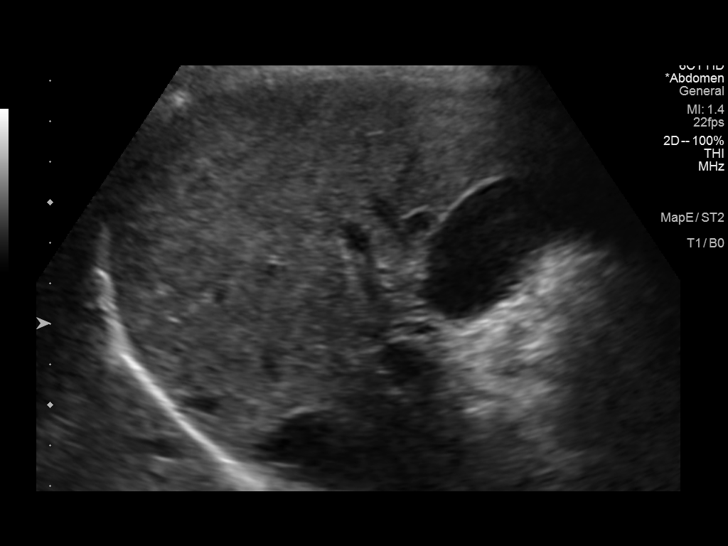
[im 37/45]
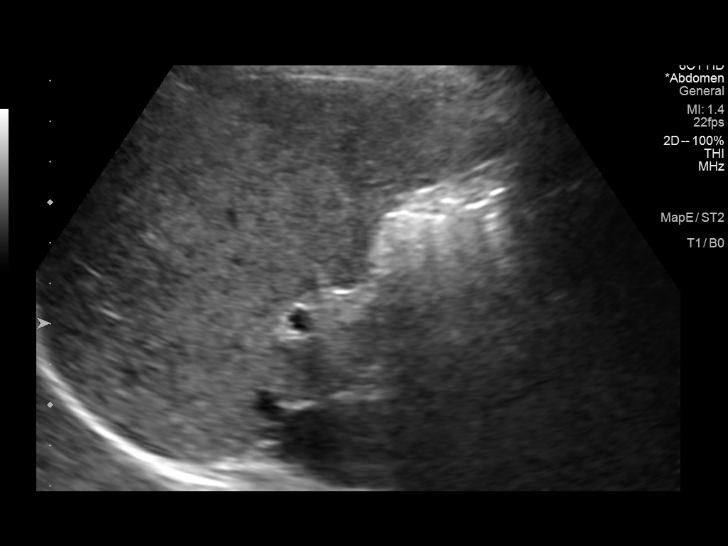
[im 41/45]
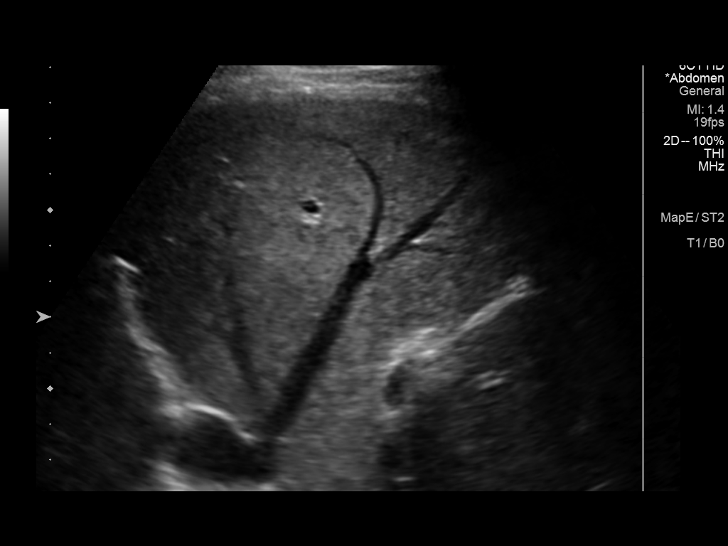
[im 45/45]
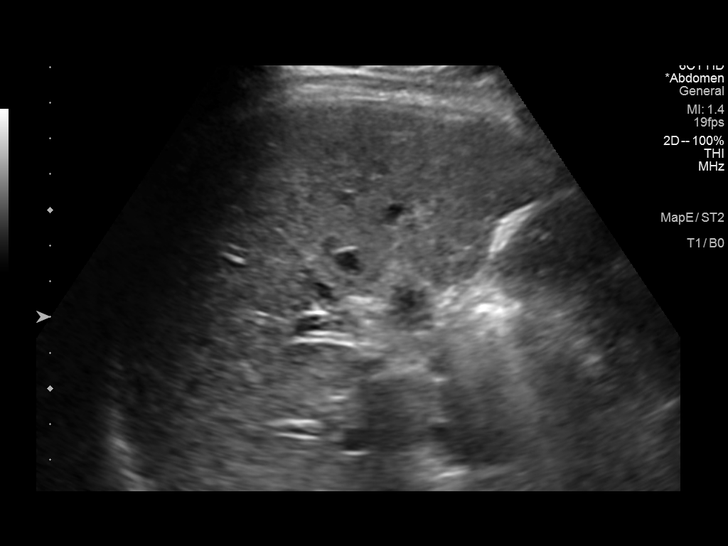

[14 of 25 positions shown; findings below may reference images not displayed]

FINDINGS: Gallbladder:

No gallstones or wall thickening visualized. No sonographic Murphy
sign noted by sonographer.

Common bile duct:

Diameter: 4.3 mm

Liver:

Coarse echotexture of the liver which is mildly hyperechoic
diffusely. No focal liver lesion. Portal vein is patent on color
Doppler imaging with normal direction of blood flow towards the
liver.

Other: Negative for ascites.
IMPRESSION: Hyperechoic liver with coarse echotexture. No focal mass lesion
liver. Negative for gallstones

## 2021-02-18 DIAGNOSIS — Z20828 Contact with and (suspected) exposure to other viral communicable diseases: Secondary | ICD-10-CM | POA: Diagnosis not present

## 2021-02-18 DIAGNOSIS — J069 Acute upper respiratory infection, unspecified: Secondary | ICD-10-CM | POA: Diagnosis not present

## 2021-02-18 DIAGNOSIS — R911 Solitary pulmonary nodule: Secondary | ICD-10-CM | POA: Diagnosis not present

## 2021-02-18 DIAGNOSIS — R042 Hemoptysis: Secondary | ICD-10-CM | POA: Diagnosis not present

## 2021-02-18 DIAGNOSIS — J209 Acute bronchitis, unspecified: Secondary | ICD-10-CM | POA: Diagnosis not present

## 2021-03-06 ENCOUNTER — Other Ambulatory Visit: Payer: Self-pay

## 2021-03-06 ENCOUNTER — Ambulatory Visit: Payer: Medicaid Other | Attending: Physician Assistant | Admitting: Physician Assistant

## 2021-03-06 ENCOUNTER — Encounter: Payer: Self-pay | Admitting: Physician Assistant

## 2021-03-06 VITALS — BP 102/74 | HR 75 | Temp 98.9°F | Resp 18 | Ht 65.0 in | Wt 134.0 lb

## 2021-03-06 DIAGNOSIS — J069 Acute upper respiratory infection, unspecified: Secondary | ICD-10-CM | POA: Diagnosis not present

## 2021-03-06 DIAGNOSIS — Z13228 Encounter for screening for other metabolic disorders: Secondary | ICD-10-CM | POA: Diagnosis not present

## 2021-03-06 DIAGNOSIS — Z13220 Encounter for screening for lipoid disorders: Secondary | ICD-10-CM

## 2021-03-06 DIAGNOSIS — Z1231 Encounter for screening mammogram for malignant neoplasm of breast: Secondary | ICD-10-CM | POA: Diagnosis not present

## 2021-03-06 DIAGNOSIS — R7989 Other specified abnormal findings of blood chemistry: Secondary | ICD-10-CM

## 2021-03-06 DIAGNOSIS — R1031 Right lower quadrant pain: Secondary | ICD-10-CM

## 2021-03-06 NOTE — Patient Instructions (Signed)
Please return for fasting labs to be completed.  We will call you with the lab results when they are available.  Roney Jaffe, PA-C Physician Assistant [facility] [email or web address]   Health Maintenance, Female Adopting a healthy lifestyle and getting preventive care are important in promoting health and wellness. Ask your health care provider about: The right schedule for you to have regular tests and exams. Things you can do on your own to prevent diseases and keep yourself healthy. What should I know about diet, weight, and exercise? Eat a healthy diet  Eat a diet that includes plenty of vegetables, fruits, low-fat dairy products, and lean protein. Do not eat a lot of foods that are high in solid fats, added sugars, or sodium. Maintain a healthy weight Body mass index (BMI) is used to identify weight problems. It estimates body fat based on height and weight. Your health care provider can help determine your BMI and help you achieve or maintain a healthy weight. Get regular exercise Get regular exercise. This is one of the most important things you can do for your health. Most adults should: Exercise for at least 150 minutes each week. The exercise should increase your heart rate and make you sweat (moderate-intensity exercise). Do strengthening exercises at least twice a week. This is in addition to the moderate-intensity exercise. Spend less time sitting. Even light physical activity can be beneficial. Watch cholesterol and blood lipids Have your blood tested for lipids and cholesterol at 40 years of age, then have this test every 5 years. Have your cholesterol levels checked more often if: Your lipid or cholesterol levels are high. You are older than 40 years of age. You are at high risk for heart disease. What should I know about cancer screening? Depending on your health history and family history, you may need to have  cancer screening at various ages. This may include screening for: Breast cancer. Cervical cancer. Colorectal cancer. Skin cancer. Lung cancer. What should I know about heart disease, diabetes, and high blood pressure? Blood pressure and heart disease High blood pressure causes heart disease and increases the risk of stroke. This is more likely to develop in people who have high blood pressure readings, are of African descent, or are overweight. Have your blood pressure checked: Every 3-5 years if you are 61-9 years of age. Every year if you are 52 years old or older. Diabetes Have regular diabetes screenings. This checks your fasting blood sugar level. Have the screening done: Once every three years after age 39 if you are at a normal weight and have a low risk for diabetes. More often and at a younger age if you are overweight or have a high risk for diabetes. What should I know about preventing infection? Hepatitis B If you have a higher risk for hepatitis B, you should be screened for this virus. Talk with your health care provider to find out if you are at risk for hepatitis B infection. Hepatitis C Testing is recommended for: Everyone born from [birth year] through 1965. Anyone with known risk factors for hepatitis C. Sexually transmitted infections (STIs) Get screened for STIs, including gonorrhea and chlamydia, if: You are sexually active and are younger than 40 years of age. You are older than 40 years of age and your health care provider tells you that you are at risk for this type of infection. Your sexual activity has changed since you were last screened, and you are at increased risk for chlamydia  or gonorrhea. Ask your health care provider if you are at risk. Ask your health care provider about whether you are at high risk for HIV. Your health care provider may recommend a prescription medicine to help prevent HIV infection. If you choose to take medicine to prevent HIV, you should  first get tested for HIV. You should then be tested every 3 months for as long as you are taking the medicine. Pregnancy If you are about to stop having your period (premenopausal) and you may become pregnant, seek counseling before you get pregnant. Take 400 to 800 micrograms (mcg) of folic acid every day if you become pregnant. Ask for birth control (contraception) if you want to prevent pregnancy. Osteoporosis and menopause Osteoporosis is a disease in which the bones lose minerals and strength with aging. This can result in bone fractures. If you are 55 years old or older, or if you are at risk for osteoporosis and fractures, ask your health care provider if you should: Be screened for bone loss. Take a calcium or vitamin D supplement to lower your risk of fractures. Be given hormone replacement therapy (HRT) to treat symptoms of menopause. Follow these instructions at home: Lifestyle Do not use any products that contain nicotine or tobacco, such as cigarettes, e-cigarettes, and chewing tobacco. If you need help quitting, ask your health care provider. Do not use street drugs. Do not share needles. Ask your health care provider for help if you need support or information about quitting drugs. Alcohol use Do not drink alcohol if: Your health care provider tells you not to drink. You are pregnant, may be pregnant, or are planning to become pregnant. If you drink alcohol: Limit how much you use to 0-1 drink a day. Limit intake if you are breastfeeding. Be aware of how much alcohol is in your drink. In the [place] one drink equals one 12 oz bottle of beer (355 mL), one 5 oz glass of wine (148 mL), or one 1 oz glass of hard liquor (44 mL). General instructions Schedule regular health, dental, and eye exams. Stay current with your vaccines. Tell your health care provider if: You often feel depressed. You have ever been abused or do not feel safe at home. Summary Adopting a healthy  lifestyle and getting preventive care are important in promoting health and wellness. Follow your health care provider's instructions about healthy diet, exercising, and getting tested or screened for diseases. Follow your health care provider's instructions on monitoring your cholesterol and blood pressure. This information is not intended to replace advice given to you by your health care provider. Make sure you discuss any questions you have with your health care provider. Document Revised: 07/01/2020 Document Reviewed: 04/16/2018 Elsevier Patient Education  2022 [employer].

## 2021-03-06 NOTE — Progress Notes (Signed)
Established Patient Office Visit  Subjective:  Patient ID: Laura Warren, female    DOB: [birth year]  Age: 40 y.o. MRN: [id number]  CC:  Chief Complaint  Patient presents with   Abdominal Pain    Chronic Right side itermittent     HPI Laura Warren reports that she was seen at an urgent care in [facility] a couple of weeks ago after having symptoms of an upper respiratory infection.  Reports that she was prescribed Augmentin, states that she did finish the course approximately 5 days ago.  Reports that she feels much improved, no residual symptoms.  Does endorse that she has been having some right lower quadrant discomfort over the last couple of days.  Describes it as intermittent, crampy.  Does endorse that she is currently menstruating, has not tried anything for relief.  Does endorse a history of menstrual pain.  No other concerns at this time  Past Medical History:  Diagnosis Date   Medical history non-contributory     Past Surgical History:  Procedure Laterality Date   APPENDECTOMY      Family History  Problem Relation Age of Onset   Cancer Mother     Social History   Socioeconomic History   Marital status: Married    Spouse name: Deepak   Number of children: 2   Years of education: Not on file   Highest education level: Not on file  Occupational History   Not on file  Tobacco Use   Smoking status: Never   Smokeless tobacco: Never  Vaping Use   Vaping Use: Never used  Substance and Sexual Activity   Alcohol use: Yes    Comment: rarely   Drug use: Never   Sexual activity: Yes    Birth control/protection: None  Other Topics Concern   Not on file  Social History Narrative   Not on file   Social Determinants of Health   Financial Resource Strain: Not on file  Food Insecurity: Not on file  Transportation Needs: Not on file  Physical Activity: Not on file  Stress: Not on file  Social Connections: Not on file  Intimate Partner  Violence: Not on file    Outpatient Medications Prior to Visit  Medication Sig Dispense Refill   busPIRone (BUSPAR) 5 MG tablet Take 1 tablet (5 mg total) by mouth 2 (two) times daily. (Patient not taking: No sig reported) 60 tablet 0   omeprazole (PRILOSEC) 20 MG capsule Take 1 capsule (20 mg total) by mouth daily. 30 capsule 3   No facility-administered medications prior to visit.    No Known Allergies  ROS Review of Systems  Constitutional:  Negative for chills and fever.  HENT:  Negative for congestion, sinus pressure, sore throat and trouble swallowing.   Eyes: Negative.   Respiratory:  Negative for cough and shortness of breath.   Cardiovascular:  Negative for chest pain.  Gastrointestinal:  Positive for abdominal pain. Negative for blood in stool, constipation, diarrhea, nausea and vomiting.  Endocrine: Negative.   Genitourinary:  Positive for pelvic pain.  Musculoskeletal: Negative.   Skin: Negative.   Allergic/Immunologic: Negative.   Neurological: Negative.   Hematological: Negative.   Psychiatric/Behavioral: Negative.       Objective:    Physical Exam Vitals and nursing note reviewed.  Constitutional:      Appearance: Normal appearance.  HENT:     Head: Normocephalic and atraumatic.     Right Ear: External ear normal.     Left  Ear: External ear normal.     Nose: Nose normal.     Mouth/Throat:     Mouth: Mucous membranes are moist.     Pharynx: Oropharynx is clear.  Eyes:     Extraocular Movements: Extraocular movements intact.     Conjunctiva/sclera: Conjunctivae normal.     Pupils: Pupils are equal, round, and reactive to light.  Cardiovascular:     Rate and Rhythm: Normal rate and regular rhythm.     Pulses: Normal pulses.     Heart sounds: Normal heart sounds.  Pulmonary:     Effort: Pulmonary effort is normal.     Breath sounds: No wheezing.  Abdominal:     General: Abdomen is flat.     Palpations: Abdomen is soft.     Tenderness: There is no  abdominal tenderness.  Musculoskeletal:        General: Normal range of motion.     Cervical back: Normal range of motion and neck supple.  Skin:    General: Skin is warm and dry.  Neurological:     General: No focal deficit present.     Mental Status: She is alert and oriented to person, place, and time.  Psychiatric:        Mood and Affect: Mood normal.        Behavior: Behavior normal.        Thought Content: Thought content normal.        Judgment: Judgment normal.    BP 102/74 (BP Location: Left Arm, Patient Position: Sitting, Cuff Size: Normal)   Pulse 75   Temp 98.9 F (37.2 C) (Oral)   Resp 18   Ht 5\' 5"  (1.651 m)   Wt 134 lb (60.8 kg)   SpO2 100%   BMI 22.30 kg/m  Wt Readings from Last 3 Encounters:  03/06/21 134 lb (60.8 kg)  04/22/20 135 lb (61.2 kg)  04/07/20 138 lb (62.6 kg)     Health Maintenance Due  Topic Date Due   COVID-19 Vaccine (3 - Booster for Moderna series) 09/02/2019   INFLUENZA VACCINE  12/05/2020    There are no preventive care reminders to display for this patient.  Lab Results  Component Value Date   TSH 1.290 11/26/2019   Lab Results  Component Value Date   WBC 5.1 04/06/2020   HGB 12.8 04/06/2020   HCT 40.4 04/06/2020   MCV 90.2 04/06/2020   PLT 157 04/06/2020   Lab Results  Component Value Date   NA 140 04/06/2020   K 3.8 04/06/2020   CO2 27 04/06/2020   GLUCOSE 99 04/06/2020   BUN 9 04/06/2020   CREATININE 0.83 04/06/2020   BILITOT 0.7 04/06/2020   ALKPHOS 31 (L) 04/06/2020   AST 19 04/06/2020   ALT 15 04/06/2020   PROT 7.1 04/06/2020   ALBUMIN 4.1 04/06/2020   CALCIUM 9.3 04/06/2020   ANIONGAP 11 04/06/2020   Lab Results  Component Value Date   CHOL 168 11/04/2019   Lab Results  Component Value Date   HDL 47 11/04/2019   Lab Results  Component Value Date   LDLCALC 108 (H) 11/04/2019   Lab Results  Component Value Date   TRIG 69 11/04/2019   Lab Results  Component Value Date   CHOLHDL 3.6  11/04/2019   No results found for: HGBA1C    Assessment & Plan:   Problem List Items Addressed This Visit   None Visit Diagnoses     Upper respiratory tract infection, unspecified  type    -  Primary   Right lower quadrant abdominal pain       Low TSH level       Relevant Orders   TSH   Encounter for screening mammogram for malignant neoplasm of breast       Relevant Orders   MM DIGITAL SCREENING BILATERAL   Screening for metabolic disorder       Relevant Orders   CBC with Differential/Platelet   Comp. Metabolic Panel (12)   Screening, lipid       Relevant Orders   Lipid panel       No orders of the defined types were placed in this encounter. 1. Upper respiratory tract infection, unspecified type Symptoms resolved after antibiotic regimen prescribed by urgent care visit.  2. Right lower quadrant abdominal pain Patient encouraged to do trial of ibuprofen over-the-counter.  Patient education given on supportive care, red flags for prompt reevaluation.  3. Low TSH level Patient to return in 1 day for fasting labs to be completed - TSH; Future  4. Encounter for screening mammogram for malignant neoplasm of breast  - MM DIGITAL SCREENING BILATERAL; Future  5. Screening for metabolic disorder  - CBC with Differential/Platelet; Future - Comp. Metabolic Panel (12); Future  6. Screening, lipid  - Lipid panel; Future   I have reviewed the patient's medical history (PMH, PSH, Social History, Family History, Medications, and allergies) , and have been updated if relevant. I spent 20 minutes reviewing chart and  face to face time with patient.     Follow-up: Return in about 1 day (around 03/07/2021) for Fasting  labs, At [facility].    Kasandra Knudsen Mayers, PA-C

## 2021-03-06 NOTE — Progress Notes (Signed)
Patient has eaten today. Patient has not taken medication today. Patient reports intermittent minimal pain in the right side of abdomen.

## 2021-03-07 ENCOUNTER — Encounter: Payer: Self-pay | Admitting: Physician Assistant

## 2021-03-13 ENCOUNTER — Other Ambulatory Visit: Payer: Medicaid Other

## 2021-04-04 ENCOUNTER — Ambulatory Visit: Payer: Medicaid Other | Attending: Family Medicine

## 2021-04-04 ENCOUNTER — Other Ambulatory Visit: Payer: Self-pay

## 2021-04-04 DIAGNOSIS — Z13220 Encounter for screening for lipoid disorders: Secondary | ICD-10-CM | POA: Diagnosis not present

## 2021-04-04 DIAGNOSIS — R7989 Other specified abnormal findings of blood chemistry: Secondary | ICD-10-CM

## 2021-04-04 DIAGNOSIS — Z13228 Encounter for screening for other metabolic disorders: Secondary | ICD-10-CM | POA: Diagnosis not present

## 2021-04-04 DIAGNOSIS — Z23 Encounter for immunization: Secondary | ICD-10-CM

## 2021-04-05 LAB — COMP. METABOLIC PANEL (12)
AST: 16 IU/L (ref 0–40)
Albumin/Globulin Ratio: 2 (ref 1.2–2.2)
Albumin: 4.5 g/dL (ref 3.8–4.8)
Alkaline Phosphatase: 33 IU/L — ABNORMAL LOW (ref 44–121)
BUN/Creatinine Ratio: 15 (ref 9–23)
BUN: 13 mg/dL (ref 6–24)
Bilirubin Total: 0.8 mg/dL (ref 0.0–1.2)
Calcium: 9 mg/dL (ref 8.7–10.2)
Chloride: 102 mmol/L (ref 96–106)
Creatinine, Ser: 0.88 mg/dL (ref 0.57–1.00)
Globulin, Total: 2.3 g/dL (ref 1.5–4.5)
Glucose: 85 mg/dL (ref 70–99)
Potassium: 4 mmol/L (ref 3.5–5.2)
Sodium: 137 mmol/L (ref 134–144)
Total Protein: 6.8 g/dL (ref 6.0–8.5)
eGFR: 85 mL/min/{1.73_m2} (ref >59)

## 2021-04-05 LAB — CBC WITH DIFFERENTIAL/PLATELET
Basophils Absolute: 0 10*3/uL (ref 0.0–0.2)
Basos: 1 %
EOS (ABSOLUTE): 0.1 10*3/uL (ref 0.0–0.4)
Eos: 1 %
Hematocrit: 38.9 % (ref 34.0–46.6)
Hemoglobin: 13.3 g/dL (ref 11.1–15.9)
Immature Grans (Abs): 0 10*3/uL (ref 0.0–0.1)
Immature Granulocytes: 0 %
Lymphocytes Absolute: 1.5 10*3/uL (ref 0.7–3.1)
Lymphs: 31 %
MCH: 28.9 pg (ref 26.6–33.0)
MCHC: 34.2 g/dL (ref 31.5–35.7)
MCV: 84 fL (ref 79–97)
Monocytes Absolute: 0.5 10*3/uL (ref 0.1–0.9)
Monocytes: 10 %
Neutrophils Absolute: 2.8 10*3/uL (ref 1.4–7.0)
Neutrophils: 57 %
Platelets: 154 10*3/uL (ref 150–450)
RBC: 4.61 x10E6/uL (ref 3.77–5.28)
RDW: 13.3 % (ref 11.7–15.4)
WBC: 4.9 10*3/uL (ref 3.4–10.8)

## 2021-04-05 LAB — LIPID PANEL
Chol/HDL Ratio: 3.8 ratio (ref 0.0–4.4)
Cholesterol, Total: 180 mg/dL (ref 100–199)
HDL: 47 mg/dL (ref >39)
LDL Chol Calc (NIH): 120 mg/dL — ABNORMAL HIGH (ref 0–99)
Triglycerides: 66 mg/dL (ref 0–149)
VLDL Cholesterol Cal: 13 mg/dL (ref 5–40)

## 2021-04-05 LAB — TSH: TSH: 1.48 u[IU]/mL (ref 0.450–4.500)

## 2021-04-12 ENCOUNTER — Telehealth: Payer: Self-pay | Admitting: *Deleted

## 2021-04-12 NOTE — Telephone Encounter (Signed)
-----   Message from Roney Jaffe, [place] sent at 04/05/2021 12:38 PM EST ----- Please call patient and let her know that her thyroid function kidney and liver function are within normal limits.  Her cholesterol overall is within normal limits, however her LDL is elevated, and is elevated more than it was 1 year prior.  I strongly encouraged her to follow a low-cholesterol diet and have this rechecked in 3 to 6 months.

## 2021-04-12 NOTE — Telephone Encounter (Signed)
Patient verified DOB Patient is aware of labs being normal except of needing follow low cholesterol diet and have level rechecked in 3 to 6 months.

## 2021-04-19 ENCOUNTER — Telehealth: Payer: Self-pay | Admitting: Nurse Practitioner

## 2021-04-19 NOTE — Telephone Encounter (Signed)
Zelda will be out of the office 12/21. Left vm for patient to call [phone] to reschedule.

## 2021-04-26 ENCOUNTER — Ambulatory Visit: Payer: Medicaid Other | Admitting: Nurse Practitioner

## 2021-04-26 ENCOUNTER — Ambulatory Visit: Payer: Medicaid Other

## 2021-07-28 ENCOUNTER — Encounter: Payer: Self-pay | Admitting: Nurse Practitioner

## 2021-07-28 ENCOUNTER — Ambulatory Visit: Payer: Medicaid Other | Attending: Nurse Practitioner | Admitting: Nurse Practitioner

## 2021-07-28 ENCOUNTER — Other Ambulatory Visit: Payer: Self-pay

## 2021-07-28 VITALS — BP 116/80 | HR 61 | Wt 126.8 lb

## 2021-07-28 DIAGNOSIS — R002 Palpitations: Secondary | ICD-10-CM | POA: Diagnosis not present

## 2021-07-28 DIAGNOSIS — Z1231 Encounter for screening mammogram for malignant neoplasm of breast: Secondary | ICD-10-CM | POA: Diagnosis not present

## 2021-07-28 DIAGNOSIS — G44209 Tension-type headache, unspecified, not intractable: Secondary | ICD-10-CM | POA: Diagnosis not present

## 2021-07-28 DIAGNOSIS — E7800 Pure hypercholesterolemia, unspecified: Secondary | ICD-10-CM

## 2021-07-28 NOTE — Patient Instructions (Signed)
You can take excedrin migraine Over the counter for headaches ?

## 2021-07-28 NOTE — Progress Notes (Signed)
? ?Assessment & Plan:  ?Laura Warren was seen today for headache. ? ?Diagnoses and all orders for this visit: ? ?Heart palpitations ?-     Ambulatory referral to Cardiology ? ?Breast cancer screening by mammogram ?-     MS DIGITAL SCREENING TOMO BILATERAL; Future ? ?Elevated LDL cholesterol level ?-     Lipid panel ? ?Acute non intractable tension-type headache ?Recommended over-the-counter Excedrin Migraine and for patient to also start a headache diary. ?Avoid all caffeine products, smoking or foods high in fat blood sugar ? ? ?Patient has been counseled on age-appropriate routine health concerns for screening and prevention. These are reviewed and up-to-date. Referrals have been placed accordingly. Immunizations are up-to-date or declined.    ?Subjective:  ? ?Chief Complaint  ?Patient presents with  ? Headache  ? ?HPI ?Laura Warren 41 y.o. female presents to office today ?With complaints of daily headaches and 1 month onset of palpitations. ?Past medical history is noncontributory  ? ?Palpitations: Patient complains of dizziness, palpitations, rapid heart beat, and shortness of breath.  The symptoms are of moderate severity, occuring during sleep and lasting several minutes per episode. Cardiac risk factors include: none. Aggravating factors: none. Relieving factors: spontaneous. Associated signs and symptoms: none  ? ? ?Headache: Patient complains of headache. She does not have a headache at this time.  ? ?Description of Headaches: ?Location of pain: temporal ?Radiation of pain?:none ?Character of pain:throbbing ?Severity of pain: 6 ?Accompanying symptoms: vertigo ?Prodromal sx?: NONE ?Rapidity of onset: gradual ?Typical duration of individual headache: several  minutes to hours ?Are most headaches similar in presentation? yes ?Typical precipitants: she is unaware of any ? ?Temporal Pattern of Headaches: ?Started having HAs 1 month ago ?Worst time of day: No particular time of the day ?Awaken from sleep?:  no ?Seasonal pattern?: no ??Clustering? of HAs over time? no ?Overall pattern since problem began: stable ? ?Degree of Functional Impairment: mild ? ?Current Use of Meds to Treat HA: ?Abortive meds? acetaminophen ?Daily use? yes - EFFECTIVE ?Prophylactic meds? NONE ? ?Additional Relevant History: ?History of head/neck trauma? no ?History of head/neck surgery? no ?Family h/o headache problems? no ?Use of meds that might worsen HAs? no ?Exposure to carbon monoxide? no ?Substance use: NONE ? ? ? ?Review of Systems  ?Constitutional:  Negative for fever, malaise/fatigue and weight loss.  ?HENT: Negative.  Negative for nosebleeds.   ?Eyes: Negative.  Negative for blurred vision, double vision and photophobia.  ?Respiratory: Negative.  Negative for cough, sputum production, shortness of breath and wheezing.   ?Cardiovascular:  Positive for palpitations. Negative for chest pain, orthopnea, claudication and leg swelling.  ?Gastrointestinal: Negative.  Negative for heartburn, nausea and vomiting.  ?Musculoskeletal: Negative.  Negative for myalgias.  ?Neurological:  Positive for dizziness and headaches. Negative for focal weakness and seizures.  ?Psychiatric/Behavioral: Negative.  Negative for suicidal ideas.   ? ?Past Medical History:  ?Diagnosis Date  ? Medical history non-contributory   ? ? ?Past Surgical History:  ?Procedure Laterality Date  ? APPENDECTOMY    ? ? ?Family History  ?Problem Relation Age of Onset  ? Cancer Mother   ? ? ?Social History Reviewed with no changes to be made today.  ? ?Outpatient Medications Prior to Visit  ?Medication Sig Dispense Refill  ? busPIRone (BUSPAR) 5 MG tablet Take 1 tablet (5 mg total) by mouth 2 (two) times daily. (Patient not taking: Reported on 12/09/2019) 60 tablet 0  ? omeprazole (PRILOSEC) 20 MG capsule Take 1 capsule (20 mg total) by  mouth daily. (Patient not taking: Reported on 07/28/2021) 30 capsule 3  ? ?No facility-administered medications prior to visit.  ? ? ?No Known  Allergies ? ?   ?Objective:  ?  ?BP 116/80   Pulse 61   Wt 126 lb 12.8 oz (57.5 kg)   SpO2 100%   BMI 21.10 kg/m?  ?Wt Readings from Last 3 Encounters:  ?07/28/21 126 lb 12.8 oz (57.5 kg)  ?03/06/21 134 lb (60.8 kg)  ?04/22/20 135 lb (61.2 kg)  ? ? ?Physical Exam ?Vitals and nursing note reviewed.  ?Constitutional:   ?   Appearance: She is well-developed.  ?HENT:  ?   Head: Normocephalic and atraumatic.  ?Cardiovascular:  ?   Rate and Rhythm: Normal rate and regular rhythm.  ?   Heart sounds: Normal heart sounds. No murmur heard. ?  No friction rub. No gallop.  ?Pulmonary:  ?   Effort: Pulmonary effort is normal. No tachypnea or respiratory distress.  ?   Breath sounds: Normal breath sounds. No decreased breath sounds, wheezing, rhonchi or rales.  ?Chest:  ?   Chest wall: No tenderness.  ?Abdominal:  ?   General: Bowel sounds are normal.  ?   Palpations: Abdomen is soft.  ?Musculoskeletal:     ?   General: Normal range of motion.  ?   Cervical back: Normal range of motion.  ?Skin: ?   General: Skin is warm and dry.  ? ?    ?Neurological:  ?   Mental Status: She is alert and oriented to person, place, and time.  ?   Coordination: Coordination normal.  ?Psychiatric:     ?   Behavior: Behavior normal. Behavior is cooperative.     ?   Thought Content: Thought content normal.     ?   Judgment: Judgment normal.  ? ? ? ? ?   ?Patient has been counseled extensively about nutrition and exercise as well as the importance of adherence with medications and regular follow-up. The patient was given clear instructions to go to ER or return to medical center if symptoms don't improve, worsen or new problems develop. The patient verbalized understanding.  ? ?Follow-up: Return for Physical ONLY no labs.  ? ?Claiborne Rigg, FNP-BC ?[facility] ?[place], [place] ?[phone]   ?07/28/2021, 9:14 AM ?

## 2021-07-28 NOTE — Progress Notes (Signed)
Pt also stated to having spot on left inner thigh. ?

## 2021-07-29 LAB — LIPID PANEL
Chol/HDL Ratio: 3.4 ratio (ref 0.0–4.4)
Cholesterol, Total: 176 mg/dL (ref 100–199)
HDL: 52 mg/dL (ref >39)
LDL Chol Calc (NIH): 110 mg/dL — ABNORMAL HIGH (ref 0–99)
Triglycerides: 77 mg/dL (ref 0–149)
VLDL Cholesterol Cal: 14 mg/dL (ref 5–40)

## 2021-07-31 NOTE — Progress Notes (Deleted)
?Cardiology Office Note:   ? ?Date:  07/31/2021  ? ?ID:  Laura Warren, DOB 1980-07-15, MRN [id number] ? ?PCP:  Gildardo Pounds, NP ?  ?[facility] Providers ?Cardiologist:  None { ? ? ?Referring MD: Gildardo Pounds, NP  ? ? ? ?History of Present Illness:   ? ?Laura Warren is a 41 y.o. female with no significant past medical history who was referred by Geryl Rankins, NP for further evaluation of palpitations.  ? ?Past Medical History:  ?Diagnosis Date  ? Medical history non-contributory   ? ? ?Past Surgical History:  ?Procedure Laterality Date  ? APPENDECTOMY    ? ? ?Current Medications: ?No outpatient medications have been marked as taking for the 08/01/21 encounter (Appointment) with Freada Bergeron, MD.  ?  ? ?Allergies:   Patient has no known allergies.  ? ?Social History  ? ?Socioeconomic History  ? Marital status: Married  ?  Spouse name: Vernon Prey  ? Number of children: 2  ? Years of education: Not on file  ? Highest education level: Not on file  ?Occupational History  ? Not on file  ?Tobacco Use  ? Smoking status: Never  ? Smokeless tobacco: Never  ?Vaping Use  ? Vaping Use: Never used  ?Substance and Sexual Activity  ? Alcohol use: Yes  ?  Comment: rarely  ? Drug use: Never  ? Sexual activity: Yes  ?  Birth control/protection: None  ?Other Topics Concern  ? Not on file  ?Social History Narrative  ? Not on file  ? ?Social Determinants of Health  ? ?Financial Resource Strain: Not on file  ?Food Insecurity: Not on file  ?Transportation Needs: Not on file  ?Physical Activity: Not on file  ?Stress: Not on file  ?Social Connections: Not on file  ?  ? ?Family History: ?The patient's ***family history includes Cancer in her mother. ? ?ROS:   ?Please see the history of present illness.    ?*** All other systems reviewed and are negative. ? ?EKGs/Labs/Other Studies Reviewed:   ? ?The following studies were reviewed today: ?*** ? ?EKG:  EKG is *** ordered today.  The ekg ordered today demonstrates  *** ? ?Recent Labs: ?04/04/2021: BUN 13; Creatinine, Ser 0.88; Hemoglobin 13.3; Platelets 154; Potassium 4.0; Sodium 137; TSH 1.480  ?Recent Lipid Panel ?   ?Component Value Date/Time  ? CHOL 176 07/28/2021 0910  ? TRIG 77 07/28/2021 0910  ? HDL 52 07/28/2021 0910  ? CHOLHDL 3.4 07/28/2021 0910  ? CHOLHDL 3.9 07/24/2013 0947  ? VLDL 16 07/24/2013 0947  ? [place] 110 (H) 07/28/2021 0910  ? ? ? ?Risk Assessment/Calculations:   ?{Does this patient have ATRIAL FIBRILLATION?[phone]} ? ?    ? ?Physical Exam:   ? ?VS:  There were no vitals taken for this visit.   ? ?Wt Readings from Last 3 Encounters:  ?07/28/21 126 lb 12.8 oz (57.5 kg)  ?03/06/21 134 lb (60.8 kg)  ?04/22/20 135 lb (61.2 kg)  ?  ? ?GEN: *** Well nourished, well developed in no acute distress ?HEENT: Normal ?NECK: No JVD; No carotid bruits ?LYMPHATICS: No lymphadenopathy ?CARDIAC: ***RRR, no murmurs, rubs, gallops ?RESPIRATORY:  Clear to auscultation without rales, wheezing or rhonchi  ?ABDOMEN: Soft, non-tender, non-distended ?MUSCULOSKELETAL:  No edema; No deformity  ?SKIN: Warm and dry ?NEUROLOGIC:  Alert and oriented x 3 ?PSYCHIATRIC:  Normal affect  ? ?ASSESSMENT:   ? ?No diagnosis found. ?PLAN:   ? ?In order of problems listed above: ? ?#Palpitations: ?-Check cardiac  monitor ?-Increase hydration ?-Decrease caffeine intake ?-PO Mg at night ? ?   ? ?{Are you ordering a CV Procedure (e.g. stress test, cath, DCCV, TEE, etc)?   Press F2        :[id number]  ? ? ?Medication Adjustments/Labs and Tests Ordered: ?Current medicines are reviewed at length with the patient today.  Concerns regarding medicines are outlined above.  ?No orders of the defined types were placed in this encounter. ? ?No orders of the defined types were placed in this encounter. ? ? ?There are no Patient Instructions on file for this visit.  ? ?Signed, ?Freada Bergeron, MD  ?07/31/2021 8:52 PM    ?[place] ?

## 2021-08-01 ENCOUNTER — Encounter: Payer: Self-pay | Admitting: Cardiology

## 2021-08-01 ENCOUNTER — Other Ambulatory Visit: Payer: Self-pay

## 2021-08-01 ENCOUNTER — Ambulatory Visit: Payer: Medicaid Other | Admitting: Cardiology

## 2021-08-01 ENCOUNTER — Ambulatory Visit (INDEPENDENT_AMBULATORY_CARE_PROVIDER_SITE_OTHER): Payer: Medicaid Other

## 2021-08-01 VITALS — BP 110/68 | HR 76 | Ht 65.0 in | Wt 128.2 lb

## 2021-08-01 DIAGNOSIS — R002 Palpitations: Secondary | ICD-10-CM

## 2021-08-01 NOTE — Progress Notes (Signed)
?Cardiology Office Note:   ? ?Date:  08/01/2021  ? ?ID:  Laura Warren, DOB Dec 16, 1980, MRN [id number] ? ?PCP:  Claiborne Rigg, NP ?  ?[facility] Providers ?Cardiologist:  None { ? ? ?Referring MD: Claiborne Rigg, NP  ? ? ?History of Present Illness:   ? ?Laura Warren is a 41 y.o. female with no significant past medical history who was referred by Bertram Denver, NP for further evaluation of palpitations.  ? ?She saw Bertram Denver, NP on 07/28/2021 complaining of daily headaches. She also noted having palpitations with onset 1 month prior, associated with dizziness, rapid heart beat, and shortness of breath. They were of moderate severity and usually occurred while sleeping lasting several minutes. ? ?Today, the patient states that she has been having palpitations with shortness of breath which is waking her up from sleeping.  This has been ongoing intermittently for 2 months. Last week her palpitations occurred about 4 times, usually lasting for minutes at a time. Her episodes only occur at night, but not every night. She denies any palpitations during the day. When the palpitations occur, sometimes she feels like she may pass out or that "she is going to die." No known prior history of coronary disease or arrhythmias.  ? ?Usually she drinks water. Once in a while she may drink coffee, and she does not drink alcohol. ? ?She denies any chest pain, or peripheral edema. No lightheadedness, headaches, orthopnea, or PND. ? ?In her family she denies any history of arrhythmias. ? ?Past Medical History:  ?Diagnosis Date  ? Medical history non-contributory   ? ? ?Past Surgical History:  ?Procedure Laterality Date  ? APPENDECTOMY    ? ? ?Current Medications: ?No outpatient medications have been marked as taking for the 08/01/21 encounter (Office Visit) with Meriam Sprague, MD.  ?  ? ?Allergies:   Patient has no known allergies.  ? ?Social History  ? ?Socioeconomic History  ? Marital status: Married  ?  Spouse  name: Ayesha Rumpf  ? Number of children: 2  ? Years of education: Not on file  ? Highest education level: Not on file  ?Occupational History  ? Not on file  ?Tobacco Use  ? Smoking status: Never  ? Smokeless tobacco: Never  ?Vaping Use  ? Vaping Use: Never used  ?Substance and Sexual Activity  ? Alcohol use: Yes  ?  Comment: rarely  ? Drug use: Never  ? Sexual activity: Yes  ?  Birth control/protection: None  ?Other Topics Concern  ? Not on file  ?Social History Narrative  ? Not on file  ? ?Social Determinants of Health  ? ?Financial Resource Strain: Not on file  ?Food Insecurity: Not on file  ?Transportation Needs: Not on file  ?Physical Activity: Not on file  ?Stress: Not on file  ?Social Connections: Not on file  ?  ? ?Family History: ?The patient's family history includes Cancer in her mother. ? ?ROS:   ?Review of Systems  ?Constitutional:  Negative for chills and fever.  ?HENT:  Negative for congestion and hearing loss.   ?Eyes:  Negative for double vision and pain.  ?Respiratory:  Positive for shortness of breath. Negative for cough and sputum production.   ?Cardiovascular:  Positive for palpitations. Negative for chest pain, orthopnea, claudication, leg swelling and PND.  ?Gastrointestinal:  Negative for diarrhea and nausea.  ?Genitourinary:  Negative for hematuria.  ?Musculoskeletal:  Negative for back pain.  ?Neurological:  Negative for dizziness and headaches.  ?Endo/Heme/Allergies:  Does not bruise/bleed easily.  ?Psychiatric/Behavioral:  Negative for depression and suicidal ideas.   ? ? ?EKGs/Labs/Other Studies Reviewed:   ? ?The following studies were reviewed today: ? ?No prior cardiovascular studies available. ? ? ?EKG:  EKG is personally reviewed. ?08/01/2021: Sinus rhythm. Rate 76 bpm. ? ? ?Recent Labs: ?04/04/2021: BUN 13; Creatinine, Ser 0.88; Hemoglobin 13.3; Platelets 154; Potassium 4.0; Sodium 137; TSH 1.480  ? ?Recent Lipid Panel ?   ?Component Value Date/Time  ? CHOL 176 07/28/2021 0910  ? TRIG 77  07/28/2021 0910  ? HDL 52 07/28/2021 0910  ? CHOLHDL 3.4 07/28/2021 0910  ? CHOLHDL 3.9 07/24/2013 0947  ? VLDL 16 07/24/2013 0947  ? [place] 110 (H) 07/28/2021 0910  ? ? ? ?Risk Assessment/Calculations:   ?  ? ?    ? ?Physical Exam:   ? ?VS:  BP 110/68   Pulse 76   Ht 5\' 5"  (1.651 m)   Wt 128 lb 3.2 oz (58.2 kg)   SpO2 98%   BMI 21.33 kg/m?    ? ?Wt Readings from Last 3 Encounters:  ?08/01/21 128 lb 3.2 oz (58.2 kg)  ?07/28/21 126 lb 12.8 oz (57.5 kg)  ?03/06/21 134 lb (60.8 kg)  ?  ? ?GEN: Well nourished, well developed in no acute distress ?HEENT: Normal ?NECK: No JVD; No carotid bruits ?LYMPHATICS: No lymphadenopathy ?CARDIAC: RRR, no murmurs, rubs, gallops ?RESPIRATORY:  Clear to auscultation without rales, wheezing or rhonchi  ?ABDOMEN: Soft, non-tender, non-distended ?MUSCULOSKELETAL:  No edema; No deformity  ?SKIN: Warm and dry ?NEUROLOGIC:  Alert and oriented x 3 ?PSYCHIATRIC:  Normal affect  ? ?ASSESSMENT:   ? ?1. Palpitations   ? ?PLAN:   ? ?In order of problems listed above: ? ?#Palpitations: ?Patient with frequent episodes of nocturnal palpitations with associated SOB that frequently wake her up at night. No associated syncope and no known personal or family history of arrhythmias or coronary disease. Will obtain cardiac monitor to assess further.  ?-Check cardiac monitor ?-Increase hydration ?-Patient is already cutting back on caffeine ?-PO Mg at night ? ?   ? ?Follow-up: PRN if testing is normal. ? ?Medication Adjustments/Labs and Tests Ordered: ?Current medicines are reviewed at length with the patient today.  Concerns regarding medicines are outlined above.  ? ?Orders Placed This Encounter  ?Procedures  ? LONG TERM MONITOR (3-14 DAYS)  ? EKG 12-Lead  ? ?No orders of the defined types were placed in this encounter. ? ?Patient Instructions  ?Medication Instructions:  ? ?Your physician recommends that you continue on your current medications as directed. Please refer to the Current Medication  list given to you today. ? ?*If you need a refill on your cardiac medications before your next appointment, please call your pharmacy* ? ? ?Testing/Procedures: ? ?ZIO XT- Long Term Monitor Instructions ? ?Your physician has requested you wear a ZIO patch monitor for 7 days.  ?This is a single patch monitor. [employer] supplies one patch monitor per enrollment. Additional ?stickers are not available. Please do not apply patch if you will be having a Nuclear Stress Test,  ?Echocardiogram, Cardiac CT, MRI, or Chest Xray during the period you would be wearing the  ?monitor. The patch cannot be worn during these tests. You cannot remove and re-apply the  ?ZIO XT patch monitor.  ?Your ZIO patch monitor will be mailed 3 day USPS to your address on file. It may take 3-5 days  ?to receive your monitor after you have been enrolled.  ?Once you  have received your monitor, please review the enclosed instructions. Your monitor  ?has already been registered assigning a specific monitor serial # to you. ? ?Billing and Patient Assistance Program Information ? ?We have supplied [employer] with any of your insurance information on file for billing purposes. ?[employer] offers a sliding scale Patient Assistance Program for patients that do not have  ?insurance, or whose insurance does not completely cover the cost of the ZIO monitor.  ?You must apply for the Patient Assistance Program to qualify for this discounted rate.  ?To apply, please call [employer] at [phone], select option 4, select option 2, ask to apply for  ?Patient Assistance Program. Theodore Demark will ask your household income, and how many people  ?are in your household. They will quote your out-of-pocket cost based on that information.  ?[employer] will also be able to set up a 23-month, interest-free payment plan if needed. ? ?Applying the monitor ?  ?Shave hair from upper left chest.  ?Hold abrader disc by orange tab. Rub abrader in 40 strokes over the upper left chest as  ?indicated  in your monitor instructions.  ?Clean area with 4 enclosed alcohol pads. Let dry.  ?Apply patch as indicated in monitor instructions. Patch will be placed under collarbone on left  ?side of chest with arrow

## 2021-08-01 NOTE — Patient Instructions (Signed)
Medication Instructions:  ° °Your physician recommends that you continue on your current medications as directed. Please refer to the Current Medication list given to you today. ° °*If you need a refill on your cardiac medications before your next appointment, please call your pharmacy* ° ° °Testing/Procedures: ° °ZIO XT- Long Term Monitor Instructions ° °Your physician has requested you wear a ZIO patch monitor for 7 days.  °This is a single patch monitor. [employer] supplies one patch monitor per enrollment. Additional °stickers are not available. Please do not apply patch if you will be having a Nuclear Stress Test,  °Echocardiogram, Cardiac CT, MRI, or Chest Xray during the period you would be wearing the  °monitor. The patch cannot be worn during these tests. You cannot remove and re-apply the  °ZIO XT patch monitor.  °Your ZIO patch monitor will be mailed 3 day USPS to your address on file. It may take 3-5 days  °to receive your monitor after you have been enrolled.  °Once you have received your monitor, please review the enclosed instructions. Your monitor  °has already been registered assigning a specific monitor serial # to you. ° °Billing and Patient Assistance Program Information ° °We have supplied [employer] with any of your insurance information on file for billing purposes. °[employer] offers a sliding scale Patient Assistance Program for patients that do not have  °insurance, or whose insurance does not completely cover the cost of the ZIO monitor.  °You must apply for the Patient Assistance Program to qualify for this discounted rate.  °To apply, please call [employer] at [phone], select option 4, select option 2, ask to apply for  °Patient Assistance Program. [employer] will ask your household income, and how many people  °are in your household. They will quote your out-of-pocket cost based on that information.  °[employer] will also be able to set up a 12-month, interest-free payment plan if  needed. ° °Applying the monitor °  °Shave hair from upper left chest.  °Hold abrader disc by orange tab. Rub abrader in 40 strokes over the upper left chest as  °indicated in your monitor instructions.  °Clean area with 4 enclosed alcohol pads. Let dry.  °Apply patch as indicated in monitor instructions. Patch will be placed under collarbone on left  °side of chest with arrow pointing upward.  °Rub patch adhesive wings for 2 minutes. Remove white label marked "1". Remove the white  °label marked "2". Rub patch adhesive wings for 2 additional minutes.  °While looking in a mirror, press and release button in center of patch. A small green light will  °flash 3-4 times. This will be your only indicator that the monitor has been turned on.  °Do not shower for the first 24 hours. You may shower after the first 24 hours.  °Press the button if you feel a symptom. You will hear a small click. Record Date, Time and  °Symptom in the Patient Logbook.  °When you are ready to remove the patch, follow instructions on the last 2 pages of Patient  °Logbook. Stick patch monitor onto the last page of Patient Logbook.  °Place Patient Logbook in the blue and white box. Use locking tab on box and tape box closed  °securely. The blue and white box has prepaid postage on it. Please place it in the mailbox as  °soon as possible. Your physician should have your test results approximately 7 days after the  °monitor has been mailed back to [employer].  °Call [employer]   Customer Care at [phone] if you have questions regarding  °your ZIO XT patch monitor. Call them immediately if you see an orange light blinking on your  °monitor.  °If your monitor falls off in less than 4 days, contact our Monitor department at [phone].  °If your monitor becomes loose or falls off after 4 days call [employer] at [phone] for  °suggestions on securing your monitor ° ° ° °Follow-Up: ° °AS NEEDED WITH DR. PEMBERTON ° ° ° °

## 2021-08-01 NOTE — Progress Notes (Unsigned)
Enrolled for [employer] to mail a ZIO XT long term holter monitor to the patients address on file.  

## 2021-08-06 DIAGNOSIS — R002 Palpitations: Secondary | ICD-10-CM

## 2021-09-05 ENCOUNTER — Ambulatory Visit
Admission: RE | Admit: 2021-09-05 | Discharge: 2021-09-05 | Disposition: A | Discharge status: Home / Self Care | Payer: Medicaid Other | Source: Ambulatory Visit | Attending: Nurse Practitioner | Admitting: Nurse Practitioner

## 2021-09-05 DIAGNOSIS — Z1231 Encounter for screening mammogram for malignant neoplasm of breast: Secondary | ICD-10-CM

## 2022-02-19 ENCOUNTER — Ambulatory Visit: Payer: Self-pay

## 2022-02-19 NOTE — Telephone Encounter (Signed)
Unable to reach patient by phone x2. 

## 2022-02-19 NOTE — Telephone Encounter (Signed)
  Chief Complaint: Chest pain below right breast Symptoms: pain 5-6/10 chest pain - difficulty breathing Frequency: a couple of days. Pertinent Negatives: Patient denies fever, vomiting Disposition: [x] ED /[] Urgent Care (no appt availability in office) / [] Appointment(In office/virtual)/ []  [facility]/ [] Home Care/ [] Refused Recommended Disposition /[]  Mobile Bus/ []  Follow-up with PCP Additional Notes: Pt reports chest pain below right breast and short of breath for a couple of days. Followed by cardiology for palpitations.   Reason for Disposition  [1] Chest pain lasts > 5 minutes AND [2] occurred in past 3 days (72 hours) (Exception: Feels exactly the same as previously diagnosed heartburn and has accompanying sour taste in mouth.)  Answer Assessment - Initial Assessment Questions 1. LOCATION: "Where does it hurt?"       Below breast 2. RADIATION: "Does the pain go anywhere else?" (e.g., into neck, jaw, arms, back)     no 3. ONSET: "When did the chest pain begin?" (Minutes, hours or days)      A couple of days - below breast  - difficult to breathe 4. PATTERN: "Does the pain come and go, or has it been constant since it started?"  "Does it get worse with exertion?"      All the time 5. DURATION: "How long does it last" (e.g., seconds, minutes, hours)      6. SEVERITY: "How bad is the pain?"  (e.g., Scale 1-10; mild, moderate, or severe)    - MILD (1-3): doesn't interfere with normal activities     - MODERATE (4-7): interferes with normal activities or awakens from sleep    - SEVERE (8-10): excruciating pain, unable to do any normal activities       56/10 7. CARDIAC RISK FACTORS: "Do you have any history of heart problems or risk factors for heart disease?" (e.g., angina, prior heart attack; diabetes, high blood pressure, high cholesterol, smoker, or strong family history of heart disease)     Yes 8. PULMONARY RISK FACTORS: "Do you have any history of lung  disease?"  (e.g., blood clots in lung, asthma, emphysema, birth control pills)     no 9. CAUSE: "What do you think is causing the chest pain?"     no 10. OTHER SYMPTOMS: "Do you have any other symptoms?" (e.g., dizziness, nausea, vomiting, sweating, fever, difficulty breathing, cough)       Difficulty breathing 11. PREGNANCY: "Is there any chance you are pregnant?" "When was your last menstrual period?"       no  Protocols used: Chest Pain-A-AH

## 2022-02-22 ENCOUNTER — Ambulatory Visit: Payer: Medicaid Other | Attending: Internal Medicine

## 2022-02-22 ENCOUNTER — Ambulatory Visit: Payer: Medicaid Other

## 2022-02-22 DIAGNOSIS — Z23 Encounter for immunization: Secondary | ICD-10-CM

## 2022-04-04 ENCOUNTER — Encounter: Payer: Self-pay | Admitting: Nurse Practitioner

## 2022-04-04 ENCOUNTER — Ambulatory Visit: Payer: Medicaid Other | Attending: Nurse Practitioner | Admitting: Nurse Practitioner

## 2022-04-04 VITALS — BP 105/71 | HR 76 | Ht 65.0 in | Wt 134.6 lb

## 2022-04-04 DIAGNOSIS — R252 Cramp and spasm: Secondary | ICD-10-CM

## 2022-04-04 DIAGNOSIS — K219 Gastro-esophageal reflux disease without esophagitis: Secondary | ICD-10-CM | POA: Diagnosis not present

## 2022-04-04 DIAGNOSIS — R7989 Other specified abnormal findings of blood chemistry: Secondary | ICD-10-CM

## 2022-04-04 DIAGNOSIS — Z0000 Encounter for general adult medical examination without abnormal findings: Secondary | ICD-10-CM | POA: Diagnosis not present

## 2022-04-04 MED ORDER — FAMOTIDINE 40 MG PO TABS: 40.0000 mg | ORAL_TABLET | Freq: Every day | ORAL | 1 refills | Status: DC

## 2022-04-04 MED ORDER — CYCLOBENZAPRINE HCL 5 MG PO TABS: 5.0000 mg | ORAL_TABLET | Freq: Three times a day (TID) | ORAL | 1 refills | Status: DC | PRN

## 2022-04-04 NOTE — Progress Notes (Signed)
Assessment & Plan:  Laura Warren was seen today for annual exam.  Diagnoses and all orders for this visit:  Encounter for annual physical exam -     CMP14+EGFR -     CBC with Differential -     cyclobenzaprine (FLEXERIL) 5 MG tablet; Take 1 tablet (5 mg total) by mouth 3 (three) times daily as needed for muscle spasms. -     famotidine (PEPCID) 40 MG tablet; Take 1 tablet (40 mg total) by mouth daily. -     Thyroid Panel With TSH  Low TSH level -     Thyroid Panel With TSH  GERD without esophagitis -     famotidine (PEPCID) 40 MG tablet; Take 1 tablet (40 mg total) by mouth daily.  Leg cramps -     CMP14+EGFR -     CBC with Differential -     cyclobenzaprine (FLEXERIL) 5 MG tablet; Take 1 tablet (5 mg total) by mouth 3 (three) times daily as needed for muscle spasms.    Patient has been counseled on age-appropriate routine health concerns for screening and prevention. These are reviewed and up-to-date. Referrals have been placed accordingly. Immunizations are up-to-date or declined.    Subjective:   Chief Complaint  Patient presents with   Annual Exam   HPI Laura Warren 41 y.o. female presents to office today for annual physical.   She has pain under her right breast that has been present for 2 weeks now. She has experienced pain in this area as well in the past however it went away on its own. She has not taken any OTC medication for this. She describes the pain as burning.   Endorses bilateral calf pain. Mostly occurring at night and infrequently during the day with prolonged standing. Relieving factors: heat therapy, massage and tylenol. She is not a smoker and endorses drinking plenty of water throughout the day.        Review of Systems  Constitutional:  Negative for fever, malaise/fatigue and weight loss.  HENT: Negative.  Negative for nosebleeds.   Eyes: Negative.  Negative for blurred vision, double vision and photophobia.  Respiratory: Negative.  Negative  for cough, hemoptysis, sputum production, shortness of breath and wheezing.   Cardiovascular:  Positive for chest pain. Negative for palpitations, orthopnea, claudication, leg swelling and PND.  Gastrointestinal: Negative.  Negative for heartburn, nausea and vomiting.  Genitourinary: Negative.   Musculoskeletal:  Positive for myalgias. Negative for back pain, falls, joint pain and neck pain.  Skin: Negative.   Neurological: Negative.  Negative for dizziness, focal weakness, seizures and headaches.  Endo/Heme/Allergies: Negative.   Psychiatric/Behavioral: Negative.  Negative for suicidal ideas.     Past Medical History:  Diagnosis Date   Medical history non-contributory     Past Surgical History:  Procedure Laterality Date   APPENDECTOMY      Family History  Problem Relation Age of Onset   Cancer Mother     Social History Reviewed with no changes to be made today.   No outpatient medications prior to visit.   No facility-administered medications prior to visit.    No Known Allergies     Objective:    BP 105/71   Pulse 76   Ht _0  (1.651 m)   Wt 134 lb 9.6 oz (61.1 kg)   LMP 03/17/2022 (Exact Date)   SpO2 99%   BMI 22.40 kg/m  Wt Readings from Last 3 Encounters:  04/04/22 134 lb 9.6 oz (61.1 kg)  08/01/21 128 lb 3.2 oz (58.2 kg)  07/28/21 126 lb 12.8 oz (57.5 kg)    Physical Exam Constitutional:      Appearance: She is well-developed.  HENT:     Head: Normocephalic and atraumatic.     Right Ear: Hearing, tympanic membrane, ear canal and external ear normal.     Left Ear: Hearing, tympanic membrane, ear canal and external ear normal.     Nose: Nose normal.     Right Turbinates: Not enlarged.     Left Turbinates: Not enlarged.     Mouth/Throat:     Lips: Pink.     Mouth: Mucous membranes are moist.     Dentition: No dental tenderness, gingival swelling, dental abscesses or gum lesions.     Pharynx: No oropharyngeal exudate.  Eyes:     General: No  scleral icterus.       Right eye: No discharge.     Extraocular Movements: Extraocular movements intact.     Conjunctiva/sclera: Conjunctivae normal.     Pupils: Pupils are equal, round, and reactive to light.  Neck:     Thyroid: No thyromegaly.     Trachea: No tracheal deviation.  Cardiovascular:     Rate and Rhythm: Normal rate and regular rhythm.     Heart sounds: Normal heart sounds. No murmur heard.    No friction rub.  Pulmonary:     Effort: Pulmonary effort is normal. No accessory muscle usage or respiratory distress.     Breath sounds: Normal breath sounds. No decreased breath sounds, wheezing, rhonchi or rales.  Abdominal:     General: Bowel sounds are normal. There is no distension.     Palpations: Abdomen is soft. There is no mass.     Tenderness: There is no abdominal tenderness. There is no right CVA tenderness, left CVA tenderness, guarding or rebound.     Hernia: No hernia is present.  Musculoskeletal:        General: No tenderness or deformity. Normal range of motion.     Cervical back: Normal range of motion and neck supple.  Lymphadenopathy:     Cervical: No cervical adenopathy.  Skin:    General: Skin is warm and dry.     Findings: No erythema.  Neurological:     Mental Status: She is alert and oriented to person, place, and time.     Cranial Nerves: No cranial nerve deficit.     Motor: Motor function is intact.     Coordination: Coordination is intact. Coordination normal.     Gait: Gait is intact.     Deep Tendon Reflexes:     Reflex Scores:      Patellar reflexes are 1+ on the right side and 1+ on the left side. Psychiatric:        Attention and Perception: Attention normal.        Mood and Affect: Mood normal.        Speech: Speech normal.        Behavior: Behavior normal.        Thought Content: Thought content normal.        Judgment: Judgment normal.          Patient has been counseled extensively about nutrition and exercise as well as the  importance of adherence with medications and regular follow-up. The patient was given clear instructions to go to ER or return to medical center if symptoms don't improve, worsen or new problems develop. The patient verbalized understanding.  Follow-up: Return if symptoms worsen or fail to improve.   Gildardo Pounds, FNP-BC [facility] [place], [place]   04/04/2022, 3:40 PM

## 2022-04-04 NOTE — Progress Notes (Signed)
Pain below right breast Calf pain

## 2022-04-05 LAB — CBC WITH DIFFERENTIAL/PLATELET
Basophils Absolute: 0 10*3/uL (ref 0.0–0.2)
Basos: 1 %
EOS (ABSOLUTE): 0.1 10*3/uL (ref 0.0–0.4)
Eos: 1 %
Hematocrit: 39.7 % (ref 34.0–46.6)
Hemoglobin: 12.8 g/dL (ref 11.1–15.9)
Immature Grans (Abs): 0 10*3/uL (ref 0.0–0.1)
Immature Granulocytes: 0 %
Lymphocytes Absolute: 1.8 10*3/uL (ref 0.7–3.1)
Lymphs: 29 %
MCH: 28.4 pg (ref 26.6–33.0)
MCHC: 32.2 g/dL (ref 31.5–35.7)
MCV: 88 fL (ref 79–97)
Monocytes Absolute: 0.5 10*3/uL (ref 0.1–0.9)
Monocytes: 7 %
Neutrophils Absolute: 3.9 10*3/uL (ref 1.4–7.0)
Neutrophils: 62 %
Platelets: 131 10*3/uL — ABNORMAL LOW (ref 150–450)
RBC: 4.5 x10E6/uL (ref 3.77–5.28)
RDW: 11.9 % (ref 11.7–15.4)
WBC: 6.3 10*3/uL (ref 3.4–10.8)

## 2022-04-05 LAB — CMP14+EGFR
ALT: 11 IU/L (ref 0–32)
AST: 14 IU/L (ref 0–40)
Albumin/Globulin Ratio: 2.1 (ref 1.2–2.2)
Albumin: 4.4 g/dL (ref 3.9–4.9)
Alkaline Phosphatase: 32 IU/L — ABNORMAL LOW (ref 44–121)
BUN/Creatinine Ratio: 17 (ref 9–23)
BUN: 12 mg/dL (ref 6–24)
Bilirubin Total: 0.5 mg/dL (ref 0.0–1.2)
CO2: 25 mmol/L (ref 20–29)
Calcium: 8.8 mg/dL (ref 8.7–10.2)
Chloride: 103 mmol/L (ref 96–106)
Creatinine, Ser: 0.7 mg/dL (ref 0.57–1.00)
Globulin, Total: 2.1 g/dL (ref 1.5–4.5)
Glucose: 70 mg/dL (ref 70–99)
Potassium: 3.7 mmol/L (ref 3.5–5.2)
Sodium: 139 mmol/L (ref 134–144)
Total Protein: 6.5 g/dL (ref 6.0–8.5)
eGFR: 111 mL/min/{1.73_m2} (ref >59)

## 2022-04-05 LAB — THYROID PANEL WITH TSH
Free Thyroxine Index: 2.4 (ref 1.2–4.9)
T3 Uptake Ratio: 27 % (ref 24–39)
T4, Total: 8.9 ug/dL (ref 4.5–12.0)
TSH: 1.2 u[IU]/mL (ref 0.450–4.500)

## 2022-05-03 ENCOUNTER — Ambulatory Visit: Payer: Self-pay | Admitting: *Deleted

## 2022-05-03 NOTE — Telephone Encounter (Signed)
Summary: pt having body aches/ took soonest appt available to touch base with her dr 2/14/ general advice   Pt states is having lots of body aches and bones ach, no appat available, made appt for pt for 2/14 b/c wants to check in with dr but wants a nurse fu call for pointers. Fu at [phone]       Chief Complaint: body aches  Symptoms: back and knee pain. Body aches , bones aches. Has tried muscle relaxer and when stopped body aches worsening . Affected sleep. Has been seen for this before. Taking ibuprofen but does not stop body aches  Frequency: couple of days Pertinent Negatives: Patient denies fever, no rash no swelling to joints Disposition: [] ED /[] Urgent Care (no appt availability in office) / [x] Appointment(In office/virtual)/ []  [place] [facility]/ [] Home Care/ [] Refused Recommended Disposition /[] [place] [facility]/ [x]  Follow-up with PCP Additional Notes:   Earliest appt 06/20/22. Please advise regarding body aches.      Reason for Disposition  Muscle aches are a chronic symptom (recurrent or ongoing AND present > 4 weeks)  Answer Assessment - Initial Assessment Questions 1. ONSET: "When did the muscle aches or body pains start?"      Has been seen for this issue and completed medication and when stopped taking medication sx returned a couple of days ago  2. LOCATION: "What part of your body is hurting?" (e.g., entire body, arms, legs)      Back, knees 3. SEVERITY: "How bad is the pain?" (Scale 1-10; or mild, moderate, severe)   - MILD (1-3): doesn't interfere with normal activities    - MODERATE (4-7): interferes with normal activities or awakens from sleep    - SEVERE (8-10):  excruciating pain, unable to do any normal activities      Does not interfere with normal activities but has affected sleep at times.  4. CAUSE: "What do you think is causing the pains?"     Not sure  5. FEVER: "Have you been having fever?"     no 6. OTHER SYMPTOMS: "Do you have any  other symptoms?" (e.g., chest pain, weakness, rash, cold or flu symptoms, weight loss)     Bones aches, body aches  7. PREGNANCY: "Is there any chance you are pregnant?" "When was your last menstrual period?"     na 8. TRAVEL: "Have you traveled out of the country in the last month?" (e.g., travel history, exposures)     na  Protocols used: Muscle Aches and Body Pain-A-AH

## 2022-05-08 NOTE — Telephone Encounter (Signed)
Not sure why she stopped the muscle relaxant. She has refills on it. She can also take tylenol for pain. Tumeric for joint pain.

## 2022-05-08 NOTE — Telephone Encounter (Signed)
Please advise 

## 2022-05-08 NOTE — Telephone Encounter (Signed)
Voicemail left with response from provider.

## 2022-06-05 ENCOUNTER — Other Ambulatory Visit: Payer: Self-pay | Admitting: Nurse Practitioner

## 2022-06-05 DIAGNOSIS — K219 Gastro-esophageal reflux disease without esophagitis: Secondary | ICD-10-CM

## 2022-06-05 DIAGNOSIS — Z0000 Encounter for general adult medical examination without abnormal findings: Secondary | ICD-10-CM

## 2022-06-05 NOTE — Telephone Encounter (Signed)
Requested Prescriptions  Pending Prescriptions Disp Refills   famotidine (PEPCID) 40 MG tablet [Pharmacy Med Name: FAMOTIDINE 40MG  TABLETS] 30 tablet 1    Sig: TAKE 1 TABLET(40 MG) BY MOUTH DAILY     Gastroenterology:  H2 Antagonists Passed - 06/05/2022  3:14 AM      Passed - Valid encounter within last 12 months    Recent Outpatient Visits           2 months ago Encounter for annual physical exam   [place], Vernia Buff, NP   10 months ago Heart palpitations   [place], Vernia Buff, NP   1 year ago Upper respiratory tract infection, unspecified type   [place], [place]   2 years ago Pelvic pain   [place], NP   2 years ago Bacterial vaginosis   [place] Elsie Stain, MD       Future Appointments             In 2 weeks Gildardo Pounds, NP [place]

## 2022-06-12 ENCOUNTER — Ambulatory Visit: Payer: Self-pay | Admitting: *Deleted

## 2022-06-12 NOTE — Telephone Encounter (Signed)
Reason for Disposition  Numbness in a leg or foot (i.e., loss of sensation)  Answer Assessment - Initial Assessment Questions 1. ONSET: "When did the pain start?"      Couple days 2. LOCATION: "Where is the pain located?"      Left leg- above the knee 3. PAIN: "How bad is the pain?"    (Scale 1-10; or mild, moderate, severe)   -  MILD (1-3): doesn't interfere with normal activities    -  MODERATE (4-7): interferes with normal activities (e.g., work or school) or awakens from sleep, limping    -  SEVERE (8-10): excruciating pain, unable to do any normal activities, unable to walk     moderate 4. WORK OR EXERCISE: "Has there been any recent work or exercise that involved this part of the body?"      no 5. CAUSE: "What do you think is causing the leg pain?"     unsure 6. OTHER SYMPTOMS: "Do you have any other symptoms?" (e.g., chest pain, back pain, breathing difficulty, swelling, rash, fever, numbness, weakness)     Hard lift, walk, numbness 7. PREGNANCY: "Is there any chance you are pregnant?" "When was your last menstrual period?"     na  Protocols used: Leg Pain-A-AH

## 2022-06-12 NOTE — Telephone Encounter (Signed)
  Chief Complaint: leg pain/numbness Symptoms: left leg pain/numbness, patient does have knot in thigh- has been there for some time Frequency: 2 days Pertinent Negatives: Patient denies swelling, redness Disposition: [] ED /[x] Urgent Care (no appt availability in office) / [] Appointment(In office/virtual)/ []  [place] [facility]/ [] Home Care/ [] Refused Recommended Disposition /[] [place] [facility]/ []  Follow-up with PCP Additional Notes: no office appointment/mobile unit not available - UC advised

## 2022-06-12 NOTE — Telephone Encounter (Signed)
Call placed to patient unable to reach message left on VM.  

## 2022-06-20 ENCOUNTER — Encounter: Payer: Self-pay | Admitting: Nurse Practitioner

## 2022-06-20 ENCOUNTER — Ambulatory Visit: Payer: Medicaid Other | Attending: Nurse Practitioner | Admitting: Nurse Practitioner

## 2022-06-20 VITALS — BP 108/74 | HR 84 | Ht 65.0 in | Wt 135.2 lb

## 2022-06-20 DIAGNOSIS — J069 Acute upper respiratory infection, unspecified: Secondary | ICD-10-CM

## 2022-06-20 DIAGNOSIS — R2242 Localized swelling, mass and lump, left lower limb: Secondary | ICD-10-CM | POA: Diagnosis not present

## 2022-06-20 MED ORDER — BENZONATATE 100 MG PO CAPS: 100.0000 mg | ORAL_CAPSULE | Freq: Two times a day (BID) | ORAL | 0 refills | Status: DC | PRN

## 2022-06-20 MED ORDER — FLUTICASONE PROPIONATE 50 MCG/ACT NA SUSP: 2.0000 | Freq: Every day | NASAL | 0 refills | Status: DC

## 2022-06-20 MED ORDER — AZITHROMYCIN 250 MG PO TABS: ORAL_TABLET | ORAL | 0 refills | Status: AC

## 2022-06-20 NOTE — Progress Notes (Signed)
Assessment & Plan:  Laura Warren was seen today for cough and mass.  Diagnoses and all orders for this visit:  Viral URI with cough -     benzonatate (TESSALON) 100 MG capsule; Take 1 capsule (100 mg total) by mouth 2 (two) times daily as needed for cough. -     fluticasone (FLONASE) 50 MCG/ACT nasal spray; Place 2 sprays into both nostrils daily. -     azithromycin (ZITHROMAX) 250 MG tablet; Take 2 tablets on day 1, then 1 tablet daily on days 2 through 5  Leg mass, left -     [place] LT LOWER EXTREM LTD SOFT TISSUE NON VASCULAR; Future    Patient has been counseled on age-appropriate routine health concerns for screening and prevention. These are reviewed and up-to-date. Referrals have been placed accordingly. Immunizations are up-to-date or declined.    Subjective:   Chief Complaint  Patient presents with   Cough   Mass    On left leg    HPI Laura Warren 42 y.o. female presents to office today with URI and concerns of left thigh mass which she reports has increased in size.   She has a past medical history of Medical history non-contributory.   Cough: Patient complains of productive cough.  Symptoms began a few weeks ago.  The cough is without wheezing, dyspnea or hemoptysis, productive of green/yellow sputum, chest is painful during coughing, harsh, nocturnal and is aggravated by nothing Associated symptoms include: none . Patient does not have new pets. Patient does not have a history of asthma. Patient does not have a history of environmental allergens. Patient did not have recent travel. Patient does not have a history of smoking. Patient  does not have previous Chest X-ray.   She has a left inner thigh mass that has progressively increased in size since last year. She would like to have it evaluated. There is a palpable lipoma like mass upon presentation today    Review of Systems  Constitutional:  Negative for fever, malaise/fatigue and weight loss.  HENT: Negative.   Negative for nosebleeds.   Eyes: Negative.  Negative for blurred vision, double vision and photophobia.  Respiratory:  Positive for cough and sputum production. Negative for hemoptysis, shortness of breath and wheezing.   Cardiovascular: Negative.  Negative for chest pain, palpitations and leg swelling.  Gastrointestinal: Negative.  Negative for heartburn, nausea and vomiting.  Musculoskeletal: Negative.  Negative for myalgias.  Skin:        SEE HPI  Neurological: Negative.  Negative for dizziness, focal weakness, seizures and headaches.  Psychiatric/Behavioral: Negative.  Negative for suicidal ideas.     Past Medical History:  Diagnosis Date   Medical history non-contributory     Past Surgical History:  Procedure Laterality Date   APPENDECTOMY      Family History  Problem Relation Age of Onset   Cancer Mother     Social History Reviewed with no changes to be made today.   Outpatient Medications Prior to Visit  Medication Sig Dispense Refill   cyclobenzaprine (FLEXERIL) 5 MG tablet Take 1 tablet (5 mg total) by mouth 3 (three) times daily as needed for muscle spasms. 30 tablet 1   famotidine (PEPCID) 40 MG tablet TAKE 1 TABLET(40 MG) BY MOUTH DAILY 30 tablet 1   No facility-administered medications prior to visit.    No Known Allergies     Objective:    BP 108/74   Pulse 84   Ht 5' 5"$  (1.651 m)  Wt 135 lb 3.2 oz (61.3 kg)   LMP 06/01/2022 (Exact Date)   SpO2 98%   BMI 22.50 kg/m  Wt Readings from Last 3 Encounters:  06/20/22 135 lb 3.2 oz (61.3 kg)  04/04/22 134 lb 9.6 oz (61.1 kg)  08/01/21 128 lb 3.2 oz (58.2 kg)    Physical Exam Vitals and nursing note reviewed.  Constitutional:      Appearance: She is well-developed.  HENT:     Head: Normocephalic and atraumatic.  Cardiovascular:     Rate and Rhythm: Normal rate and regular rhythm.     Heart sounds: Normal heart sounds. No murmur heard.    No friction rub. No gallop.  Pulmonary:     Effort:  Pulmonary effort is normal. No tachypnea or respiratory distress.     Breath sounds: Normal breath sounds. No decreased breath sounds, wheezing, rhonchi or rales.  Chest:     Chest wall: No tenderness.  Abdominal:     General: Bowel sounds are normal.     Palpations: Abdomen is soft.  Musculoskeletal:        General: Normal range of motion.     Cervical back: Normal range of motion.     Left upper leg: Tenderness present.       Legs:     Comments: 1 cm palpable soft tissue lipoma presenting mass  Skin:    General: Skin is warm and dry.  Neurological:     Mental Status: She is alert and oriented to person, place, and time.     Coordination: Coordination normal.  Psychiatric:        Behavior: Behavior normal. Behavior is cooperative.        Thought Content: Thought content normal.        Judgment: Judgment normal.          Patient has been counseled extensively about nutrition and exercise as well as the importance of adherence with medications and regular follow-up. The patient was given clear instructions to go to ER or return to medical center if symptoms don't improve, worsen or new problems develop. The patient verbalized understanding.   Follow-up: Return in about 2 weeks (around 07/04/2022) for virtual visit DB 1110 or 130 for COUGH.   Gildardo Pounds, FNP-BC [facility] and [facility] [place], [place]   06/20/2022, 9:03 PM

## 2022-06-20 NOTE — Progress Notes (Signed)
Patient had bump on left leg, cough and chest pain for for over two weeks.

## 2022-06-21 NOTE — Progress Notes (Signed)
Scheduled

## 2022-06-25 ENCOUNTER — Ambulatory Visit (HOSPITAL_BASED_OUTPATIENT_CLINIC_OR_DEPARTMENT_OTHER)
Admission: RE | Admit: 2022-06-25 | Discharge: 2022-06-25 | Disposition: A | Discharge status: Home / Self Care | Payer: Medicaid Other | Source: Ambulatory Visit | Attending: Nurse Practitioner | Admitting: Nurse Practitioner

## 2022-06-25 DIAGNOSIS — D1724 Benign lipomatous neoplasm of skin and subcutaneous tissue of left leg: Secondary | ICD-10-CM | POA: Diagnosis not present

## 2022-06-25 DIAGNOSIS — R2242 Localized swelling, mass and lump, left lower limb: Secondary | ICD-10-CM

## 2022-07-03 ENCOUNTER — Telehealth: Payer: Medicaid Other | Admitting: Nurse Practitioner

## 2022-07-23 ENCOUNTER — Other Ambulatory Visit: Payer: Self-pay

## 2022-07-23 DIAGNOSIS — J069 Acute upper respiratory infection, unspecified: Secondary | ICD-10-CM

## 2022-07-23 MED ORDER — FLUTICASONE PROPIONATE 50 MCG/ACT NA SUSP: 2.0000 | Freq: Every day | NASAL | 0 refills | Status: DC

## 2022-08-16 ENCOUNTER — Encounter: Payer: Self-pay | Admitting: Physician Assistant

## 2022-08-16 ENCOUNTER — Ambulatory Visit: Payer: Medicaid Other | Attending: Physician Assistant | Admitting: Physician Assistant

## 2022-08-16 VITALS — BP 112/79 | HR 77 | Wt 133.4 lb

## 2022-08-16 DIAGNOSIS — Z0000 Encounter for general adult medical examination without abnormal findings: Secondary | ICD-10-CM

## 2022-08-16 DIAGNOSIS — J069 Acute upper respiratory infection, unspecified: Secondary | ICD-10-CM

## 2022-08-16 DIAGNOSIS — R252 Cramp and spasm: Secondary | ICD-10-CM

## 2022-08-16 DIAGNOSIS — M79605 Pain in left leg: Secondary | ICD-10-CM | POA: Diagnosis not present

## 2022-08-16 DIAGNOSIS — D1724 Benign lipomatous neoplasm of skin and subcutaneous tissue of left leg: Secondary | ICD-10-CM | POA: Diagnosis not present

## 2022-08-16 MED ORDER — FLUTICASONE PROPIONATE 50 MCG/ACT NA SUSP: 2.0000 | Freq: Every day | NASAL | 6 refills | Status: DC

## 2022-08-16 MED ORDER — CYCLOBENZAPRINE HCL 5 MG PO TABS: 5.0000 mg | ORAL_TABLET | Freq: Three times a day (TID) | ORAL | 1 refills | Status: DC | PRN

## 2022-08-16 NOTE — Patient Instructions (Signed)
Lipoma  A lipoma is a noncancerous (benign) tumor that is made up of fat cells. This is a very common type of soft-tissue growth. Lipomas are usually found under the skin (subcutaneous). They may occur in any tissue of the body that contains fat. Common areas for lipomas to appear include the back, arms, shoulders, buttocks, and thighs. Lipomas grow slowly, and they are usually painless. Most lipomas do not cause problems and do not require treatment. What are the causes? The cause of this condition is not known. What increases the risk? You are more likely to develop this condition if: You are 40-60 years old. You have a family history of lipomas. What are the signs or symptoms? A lipoma usually appears as a small, round bump under the skin. In most cases, the lump will: Feel soft or rubbery. Not cause pain or other symptoms. However, if a lipoma is located in an area where it pushes on nerves, it can become painful or cause other symptoms. How is this diagnosed? A lipoma can usually be diagnosed with a physical exam. You may also have tests to confirm the diagnosis and to rule out other conditions. Tests may include: Imaging tests, such as a CT scan or an MRI. Removal of a tissue sample to be looked at under a microscope (biopsy). How is this treated? Treatment for this condition depends on the size of the lipoma and whether it is causing any symptoms. For small lipomas that are not causing problems, no treatment is needed. If a lipoma is bigger or it causes problems, surgery may be done to remove the lipoma. Lipomas can also be removed to improve appearance. Most often, the procedure is done after applying a medicine that numbs the area (local anesthetic). Liposuction may be done to reduce the size of the lipoma before it is removed through surgery, or it may be done to remove the lipoma. Lipomas are removed with this method to limit incision size and scarring. A liposuction tube is  inserted through a small incision into the lipoma, and the contents of the lipoma are removed through the tube with suction. Follow these instructions at home: Watch your lipoma for any changes. Keep all follow-up visits. This is important. Where to find more information OrthoInfo: orthoinfo.aaos.org Contact a health care provider if: Your lipoma becomes larger or hard. Your lipoma becomes painful, red, or increasingly swollen. These could be signs of infection or a more serious condition. Get help right away if: You develop tingling or numbness in an area near the lipoma. This could indicate that the lipoma is causing nerve damage. Summary A lipoma is a noncancerous tumor that is made up of fat cells. Most lipomas do not cause problems and do not require treatment. If a lipoma is bigger or it causes problems, surgery may be done to remove the lipoma. Contact a health care provider if your lipoma becomes larger or hard, or if it becomes painful, red, or increasingly swollen. These could be signs of infection or a more serious condition. This information is not intended to replace advice given to you by your health care provider. Make sure you discuss any questions you have with your health care provider. Document Revised: 05/12/2021 Document Reviewed: 05/12/2021 Elsevier Patient Education  2023 Elsevier Inc.  

## 2022-08-16 NOTE — Progress Notes (Signed)
Patient ID: Laura Warren, female   DOB: 10-03-1980, 42 y.o.   MRN: [id number]   Laura Warren, is a 42 y.o. female  [id number]  [id number]  DOB - 1980/12/24  Chief Complaint  Patient presents with   Leg Pain       Subjective:   Laura Warren is a 42 y.o. female here today for imaging results from 2/24 lesions on her l upper leg.  Imaging shows lipoma.  Also has muscle spasm and leg feels heavy at times.    No problems updated.  ALLERGIES: No Known Allergies  PAST MEDICAL HISTORY: Past Medical History:  Diagnosis Date   Medical history non-contributory     MEDICATIONS AT HOME: Prior to Admission medications   Medication Sig Start Date End Date Taking? Authorizing Provider  benzonatate (TESSALON) 100 MG capsule Take 1 capsule (100 mg total) by mouth 2 (two) times daily as needed for cough. 06/20/22  Yes Claiborne Rigg, NP  cyclobenzaprine (FLEXERIL) 5 MG tablet Take 1 tablet (5 mg total) by mouth 3 (three) times daily as needed for muscle spasms. 04/04/22  Yes Claiborne Rigg, NP  famotidine (PEPCID) 40 MG tablet TAKE 1 TABLET(40 MG) BY MOUTH DAILY 06/05/22  Yes Claiborne Rigg, NP  fluticasone (FLONASE) 50 MCG/ACT nasal spray Place 2 sprays into both nostrils daily. 07/23/22  Yes Claiborne Rigg, NP    ROS: Neg HEENT Neg resp Neg cardiac Neg GI Neg GU Neg psych Neg neuro  Objective:   Vitals:   08/16/22 1001  BP: 112/79  Pulse: 77  SpO2: 95%  Weight: 133 lb 6.4 oz (60.5 kg)   Exam General appearance : Awake, alert, not in any distress. Speech Clear. Not toxic looking HEENT: Atraumatic and Normocephalic Neck: Supple, no JVD. No cervical lymphadenopathy.  Chest: Good air entry bilaterally, CTAB.  No rales/rhonchi/wheezing CVS: S1 S2 regular, no murmurs.  Extremities: B/L Lower Ext shows no edema, both legs are warm to touch.  L leg with full S&ROM.  B calves same size with neg homans B Neurology: Awake alert, and oriented X 3, CN II-XII  intact, Non focal Skin: No Rash  Data Review No results found for: "HGBA1C"  Assessment & Plan   1. Lipoma of left lower extremity For now she does not wish to have removed and they are not causing her pain  2. Pain of left lower extremity Can use flexeril as needed for leg discomfort    Return if symptoms worsen or fail to improve.  The patient was given clear instructions to go to ER or return to medical center if symptoms don't improve, worsen or new problems develop. The patient verbalized understanding. The patient was told to call to get lab results if they haven't heard anything in the next week.      Georgian Co, PA-C [facility] and [facility] [place], [place] [phone]   08/16/2022, 10:24 AM

## 2023-04-24 ENCOUNTER — Ambulatory Visit: Payer: Self-pay

## 2023-04-24 NOTE — Telephone Encounter (Signed)
Chief Complaint: Neck Pain Symptoms: left side neck pain 8/10 with movement Frequency: comes and goes  Pertinent Negatives: Patient denies radiating pain, headache, numbness or weakness Disposition: [] ED /[] Urgent Care (no appt availability in office) / [x] Appointment(In office/virtual)/ []  [facility]/ [] Home Care/ [] Refused Recommended Disposition /[] [facility]/ []  Follow-up with PCP Additional Notes: Patient stated she has had left side neck pain for a couple of weeks now. Patient states when she moves the neck to the left the pain is a 8/10. Tylenol does improve the pain but it always returns. Care advice was given and patient has been scheduled for the first available appointment in the office and added to the waitlist for cancellation. Advised patient to seek care at urgent care if symptoms get worse.  Reason for Disposition  [1] MODERATE neck pain (e.g., interferes with normal activities) AND [2] present > 3 days  Answer Assessment - Initial Assessment Questions 1. ONSET: "When did the pain begin?"      A couple of weeks 2. LOCATION: "Where does it hurt?"      Left side of neck 3. PATTERN "Does the pain come and go, or has it been constant since it started?"      Come and go  4. SEVERITY: "How bad is the pain?"  (Scale 1-10; or mild, moderate, severe)   - NO PAIN (0): no pain or only slight stiffness    - MILD (1-3): doesn't interfere with normal activities    - MODERATE (4-7): interferes with normal activities or awakens from sleep    - SEVERE (8-10):  excruciating pain, unable to do any normal activities      8/10 5. RADIATION: "Does the pain go anywhere else, shoot into your arms?"     No  6. CORD SYMPTOMS: "Any weakness or numbness of the arms or legs?"     No 7. CAUSE: "What do you think is causing the neck pain?"     I do not know  8. NECK OVERUSE: "Any recent activities that involved turning or twisting the neck?"     Turning makes it hurt more  9.  OTHER SYMPTOMS: "Do you have any other symptoms?" (e.g., headache, fever, chest pain, difficulty breathing, neck swelling)     No  Protocols used: Neck Pain or Stiffness-A-AH

## 2023-05-16 ENCOUNTER — Ambulatory Visit: Payer: Medicaid Other | Attending: Physician Assistant | Admitting: Physician Assistant

## 2023-05-16 ENCOUNTER — Encounter: Payer: Self-pay | Admitting: Physician Assistant

## 2023-05-16 VITALS — BP 114/76 | HR 66 | Wt 138.0 lb

## 2023-05-16 DIAGNOSIS — Z23 Encounter for immunization: Secondary | ICD-10-CM

## 2023-05-16 DIAGNOSIS — E7800 Pure hypercholesterolemia, unspecified: Secondary | ICD-10-CM

## 2023-05-16 DIAGNOSIS — D696 Thrombocytopenia, unspecified: Secondary | ICD-10-CM | POA: Diagnosis not present

## 2023-05-16 DIAGNOSIS — R0681 Apnea, not elsewhere classified: Secondary | ICD-10-CM

## 2023-05-16 DIAGNOSIS — M62838 Other muscle spasm: Secondary | ICD-10-CM

## 2023-05-16 MED ORDER — METHOCARBAMOL 500 MG PO TABS: 1000.0000 mg | ORAL_TABLET | Freq: Three times a day (TID) | ORAL | 0 refills | Status: AC | PRN

## 2023-05-16 NOTE — Patient Instructions (Signed)
 Cervical Strain and Sprain Rehab Ask your health care provider which exercises are safe for you. Do exercises exactly as told by your health care provider and adjust them as directed. It is normal to feel mild stretching, pulling, tightness, or discomfort as you do these exercises. Stop right away if you feel sudden pain or your pain gets worse. Do not begin these exercises until told by your health care provider. Stretching and range-of-motion exercises Cervical side bending  Using good posture, sit on a stable chair or stand up. Without moving your shoulders, slowly tilt your left / right ear to your shoulder until you feel a stretch in the neck muscles on the opposite side. You should be looking straight ahead. Hold for __________ seconds. Repeat with the other side of your neck. Repeat __________ times. Complete this exercise __________ times a day. Cervical rotation  Using good posture, sit on a stable chair or stand up. Slowly turn your head to the side as if you are looking over your left / right shoulder. Keep your eyes level with the ground. Stop when you feel a stretch along the side and the back of your neck. Hold for __________ seconds. Repeat this by turning to your other side. Repeat __________ times. Complete this exercise __________ times a day. Thoracic extension and pectoral stretch  Roll a towel or a small blanket so it is about 4 inches (10 cm) in diameter. Lie down on your back on a firm surface. Put the towel in the middle of your back across your spine. It should not be under your shoulder blades. Put your hands behind your head and let your elbows fall out to your sides. Hold for __________ seconds. Repeat __________ times. Complete this exercise __________ times a day. Strengthening exercises Upper cervical flexion  Lie on your back with a thin pillow behind your head or a small, rolled-up towel under your neck. Gently tuck your chin toward your chest and nod  your head down to look toward your feet. Do not lift your head off the pillow. Hold for __________ seconds. Release the tension slowly. Relax your neck muscles completely before you repeat this exercise. Repeat __________ times. Complete this exercise __________ times a day. Cervical extension  Stand about 6 inches (15 cm) away from a wall, with your back facing the wall. Place a soft object, about 6-8 inches (15-20 cm) in diameter, between the back of your head and the wall. A soft object could be a small pillow, a ball, or a folded towel. Gently tilt your head back and press into the soft object. Keep your jaw and forehead relaxed. Hold for __________ seconds. Release the tension slowly. Relax your neck muscles completely before you repeat this exercise. Repeat __________ times. Complete this exercise __________ times a day. Posture and body mechanics Body mechanics refer to the movements and positions of your body while you do your daily activities. Posture is part of body mechanics. Good posture and healthy body mechanics can help to relieve stress in your body's tissues and joints. Good posture means that your spine is in its natural S-curve position (your spine is neutral), your shoulders are pulled back slightly, and your head is not tipped forward. The following are general guidelines for using improved posture and body mechanics in your everyday activities. Sitting  When sitting, keep your spine neutral and keep your feet flat on the floor. Use a footrest, if needed, and keep your thighs parallel to the floor. Avoid rounding  your shoulders. Avoid tilting your head forward. When working at a desk or a computer, keep your desk at a height where your hands are slightly lower than your elbows. Slide your chair under your desk so you are close enough to maintain good posture. When working at a computer, place your monitor at a height where you are looking straight ahead and you do not have to  tilt your head forward or downward to look at the screen. Standing  When standing, keep your spine neutral and keep your feet about hip-width apart. Keep a slight bend in your knees. Your ears, shoulders, and hips should line up. When you do a task in which you stand in one place for a long time, place one foot up on a stable object that is 2-4 inches (5-10 cm) high, such as a footstool. This helps keep your spine neutral. Resting When lying down and resting, avoid positions that are most painful for you. Try to support your neck in a neutral position. You can use a contour pillow or a small rolled-up towel. Your pillow should support your neck but not push on it. This information is not intended to replace advice given to you by your health care provider. Make sure you discuss any questions you have with your health care provider. Document Revised: 08/27/2022 Document Reviewed: 11/13/2021 Elsevier Patient Education  2024 [employer].

## 2023-05-16 NOTE — Progress Notes (Signed)
 Patient ID: Laura Warren, female   DOB: 02-17-1981, 43 y.o.   MRN: [id number]   Laura Warren, is a 43 y.o. female  [id number]  FMW:[id number]  DOB - [birth year]  Chief Complaint  Patient presents with   Headache   Neck Pain       Subjective:   Laura Warren is a 43 y.o. female here today for L sided neck pain that has been going on for several weeks.  It started after she slept in the hospital for 2 weeks while her mom was admitted.  No UE weakness.  No paresthesias.  No grip or strength issues.  She has tried stretching.    She is also having episodes where she wakes up gasping for air and feels as though she can't get enough air.  No snoring.  Unsure about apneic episodes.  She had previous cardiac work up for this symptom that was normal.  This has been going on for a couple years.  She is interested in a sleep study.    No problems updated.  ALLERGIES: No Known Allergies  PAST MEDICAL HISTORY: Past Medical History:  Diagnosis Date   Medical history non-contributory     MEDICATIONS AT HOME: Prior to Admission medications   Medication Sig Start Date End Date Taking? Authorizing Provider  methocarbamol  (ROBAXIN ) 500 MG tablet Take 2 tablets (1,000 mg total) by mouth every 8 (eight) hours as needed. 05/16/23  Yes Danton Jon HERO, PA-C  benzonatate  (TESSALON ) 100 MG capsule Take 1 capsule (100 mg total) by mouth 2 (two) times daily as needed for cough. Patient not taking: Reported on 05/16/2023 06/20/22   Fleming, Zelda W, NP  famotidine  (PEPCID ) 40 MG tablet TAKE 1 TABLET(40 MG) BY MOUTH DAILY Patient not taking: Reported on 05/16/2023 06/05/22   Fleming, Zelda W, NP  fluticasone  (FLONASE ) 50 MCG/ACT nasal spray Place 2 sprays into both nostrils daily. Patient not taking: Reported on 05/16/2023 08/16/22   Danton Jon HERO, PA-C    ROS: Neg HEENT Neg resp Neg cardiac Neg GI Neg GU Neg MS Neg psych Neg neuro  Objective:   Vitals:   05/16/23 1553  BP: 114/76   Pulse: 66  SpO2: 100%  Weight: 138 lb (62.6 kg)   Exam General appearance : Awake, alert, not in any distress. Speech Clear. Not toxic looking HEENT: Atraumatic and Normocephalic, patent airway.  Mallampati 1 Neck: Supple, no JVD. No cervical lymphadenopathy. There is ++ spasm in L trapezius. Full ROM BUE.   Chest: Good air entry bilaterally, CTAB.  No rales/rhonchi/wheezing CVS: S1 S2 regular, no murmurs.  Extremities: B/L Lower Ext shows no edema, both legs are warm to touch Neurology: Awake alert, and oriented X 3, CN II-XII intact, Non focal Skin: No Rash  Data Review No results found for: HGBA1C  Assessment & Plan   1. Trapezius muscle spasm (Primary) - Ambulatory referral to Physical Therapy - methocarbamol  (ROBAXIN ) 500 MG tablet; Take 2 tablets (1,000 mg total) by mouth every 8 (eight) hours as needed.  Dispense: 90 tablet; Refill: 0  2. Apneic episode - Home sleep test; Future - Comprehensive metabolic panel - CBC with Differential  3. Thrombocytopenia (HCC) - CBC with Differential  4. Elevated LDL cholesterol level - Lipid Panel  5. Need for immunization against influenza - Flu vaccine trivalent PF, 6mos and older(Flulaval,Afluria,Fluarix,Fluzone)    Return if symptoms worsen or fail to improve.  The patient was given clear instructions to go to ER or return to medical center if  symptoms don't improve, worsen or new problems develop. The patient verbalized understanding. The patient was told to call to get lab results if they haven't heard anything in the next week.      Jon Moores, PA-C [facility], [place] [phone]   05/16/2023, 4:30 PM

## 2023-05-17 LAB — CBC WITH DIFFERENTIAL/PLATELET
Basophils Absolute: 0.1 10*3/uL (ref 0.0–0.2)
Basos: 1 %
EOS (ABSOLUTE): 0.1 10*3/uL (ref 0.0–0.4)
Eos: 1 %
Hematocrit: 41 % (ref 34.0–46.6)
Hemoglobin: 13.1 g/dL (ref 11.1–15.9)
Immature Grans (Abs): 0 10*3/uL (ref 0.0–0.1)
Immature Granulocytes: 0 %
Lymphocytes Absolute: 1.9 10*3/uL (ref 0.7–3.1)
Lymphs: 38 %
MCH: 28.7 pg (ref 26.6–33.0)
MCHC: 32 g/dL (ref 31.5–35.7)
MCV: 90 fL (ref 79–97)
Monocytes Absolute: 0.3 10*3/uL (ref 0.1–0.9)
Monocytes: 6 %
Neutrophils Absolute: 2.6 10*3/uL (ref 1.4–7.0)
Neutrophils: 54 %
Platelets: 142 10*3/uL — ABNORMAL LOW (ref 150–450)
RBC: 4.57 x10E6/uL (ref 3.77–5.28)
RDW: 11.9 % (ref 11.7–15.4)
WBC: 4.9 10*3/uL (ref 3.4–10.8)

## 2023-05-17 LAB — LIPID PANEL
Chol/HDL Ratio: 3.8 {ratio} (ref 0.0–4.4)
Cholesterol, Total: 201 mg/dL — ABNORMAL HIGH (ref 100–199)
HDL: 53 mg/dL (ref >39)
LDL Chol Calc (NIH): 128 mg/dL — ABNORMAL HIGH (ref 0–99)
Triglycerides: 113 mg/dL (ref 0–149)
VLDL Cholesterol Cal: 20 mg/dL (ref 5–40)

## 2023-05-17 LAB — COMPREHENSIVE METABOLIC PANEL
ALT: 18 [IU]/L (ref 0–32)
AST: 21 [IU]/L (ref 0–40)
Albumin: 4.6 g/dL (ref 3.9–4.9)
Alkaline Phosphatase: 40 [IU]/L — ABNORMAL LOW (ref 44–121)
BUN/Creatinine Ratio: 14 (ref 9–23)
BUN: 10 mg/dL (ref 6–24)
Bilirubin Total: 0.4 mg/dL (ref 0.0–1.2)
CO2: 23 mmol/L (ref 20–29)
Calcium: 9.3 mg/dL (ref 8.7–10.2)
Chloride: 99 mmol/L (ref 96–106)
Creatinine, Ser: 0.74 mg/dL (ref 0.57–1.00)
Globulin, Total: 2.3 g/dL (ref 1.5–4.5)
Glucose: 127 mg/dL — ABNORMAL HIGH (ref 70–99)
Potassium: 4 mmol/L (ref 3.5–5.2)
Sodium: 139 mmol/L (ref 134–144)
Total Protein: 6.9 g/dL (ref 6.0–8.5)
eGFR: 104 mL/min/{1.73_m2} (ref >59)

## 2023-05-20 ENCOUNTER — Other Ambulatory Visit: Payer: Self-pay | Admitting: Physician Assistant

## 2023-05-20 DIAGNOSIS — E7800 Pure hypercholesterolemia, unspecified: Secondary | ICD-10-CM

## 2023-05-20 MED ORDER — ATORVASTATIN CALCIUM 10 MG PO TABS: 10.0000 mg | ORAL_TABLET | Freq: Every day | ORAL | 3 refills | Status: AC

## 2023-05-21 ENCOUNTER — Telehealth: Payer: Self-pay

## 2023-05-21 NOTE — Telephone Encounter (Signed)
 Pt was called and is aware of results, DOB was confirmed.  ?

## 2023-05-21 NOTE — Telephone Encounter (Signed)
-----   Message from Jon Moores sent at 05/20/2023 12:51 PM EST ----- Your cholesterol is still elevated.  I sent a prescription for atorvastatin  for you t take to help with this.  Your platelets have improved but are still low which can cause bleeding.  Please avoid things that thin the blood such as alcohol and products containing NSAIDS or aspirin.  Blood sugar was a little high.  Limit  sugars and starches.  Kidney function and electrolytes are normal.  Thanks, Jon Moores, PA-C

## 2023-05-27 ENCOUNTER — Ambulatory Visit: Payer: Medicaid Other | Attending: Physician Assistant

## 2023-05-27 ENCOUNTER — Other Ambulatory Visit: Payer: Self-pay

## 2023-05-27 DIAGNOSIS — M62838 Other muscle spasm: Secondary | ICD-10-CM | POA: Diagnosis not present

## 2023-05-27 DIAGNOSIS — M436 Torticollis: Secondary | ICD-10-CM | POA: Diagnosis not present

## 2023-05-27 DIAGNOSIS — R293 Abnormal posture: Secondary | ICD-10-CM | POA: Insufficient documentation

## 2023-05-27 NOTE — Therapy (Signed)
OUTPATIENT PHYSICAL THERAPY CERVICAL EVALUATION   Patient Name: Laura Warren MRN: [id number] DOB:May 24, 1980, 43 y.o., female Today's Date: 05/27/2023  END OF SESSION:  PT End of Session - 05/27/23 1552     Visit Number 1    Date for PT Re-Evaluation 07/22/23    PT Start Time 1553    PT Stop Time 1640    PT Time Calculation (min) 47 min    Activity Tolerance Patient tolerated treatment well    Behavior During Therapy [facility] for tasks assessed/performed             Past Medical History:  Diagnosis Date   Medical history non-contributory    Past Surgical History:  Procedure Laterality Date   APPENDECTOMY     Patient Active Problem List   Diagnosis Date Noted   Bacterial vaginosis 12/09/2019    PCP: Bertram Denver, NP  REFERRING PROVIDER: Anders Simmonds   REFERRING DIAG: L trapezius muscle spasm  THERAPY DIAG:  Stiffness of cervical spine  Posture imbalance  Other muscle spasm  Rationale for Evaluation and Treatment: Rehabilitation  ONSET DATE: 3 month history  SUBJECTIVE:                                                                                                                                                                                                         SUBJECTIVE STATEMENT: Reports stiffness, locking L side of neck for about 3 months, maybe due to staying in hospital chair while her mother was hospitalized.  Taking tylenol , not helping much Hand dominance: Right  PERTINENT HISTORY:  Referred by PCP  PAIN:  Are you having pain? Yes: NPRS scale: 5 Pain location: L upper traps, L parietal region head Pain description: deep pain locks with turning head L Aggravating factors: turning head L , trying to lift heavier items Relieving factors: mild with tylenol  PRECAUTIONS: None  RED FLAGS: None     WEIGHT BEARING RESTRICTIONS: No  FALLS:  Has patient fallen in last 6 months? No  LIVING ENVIRONMENT: Lives with: lives with their  family Lives in: House/apartment Stairs: flight indoors Has following equipment at home: na  OCCUPATION: interpreter  PLOF: Independent  PATIENT GOALS: be able to sleep, be able to turn head  NEXT MD VISIT: not scheduled  OBJECTIVE:  Note: Objective measures were completed at Evaluation unless otherwise noted.  DIAGNOSTIC FINDINGS:  none  PATIENT SURVEYS:  NDI Neck Disability Index score: 10 / 50 = 20.0 %  COGNITION: Overall cognitive status: Within functional limits for tasks assessed  SENSATION: [facility]  POSTURE: rounded shoulders, increased lumbar lordosis, and increased thoracic kyphosis  PALPATION: Hypersensitive L upper traps, levator, L supraspinatus, and L paraspinals C 4/5, also decreased mobility T 3,4,5 and L post ribs   CERVICAL ROM:   Active ROM A/PROM (deg) eval  Flexion wfl  Extension 50% with hard stop and R cer rotation at end range  Right lateral flexion   Left lateral flexion   Right rotation 75% with pain  Left rotation 50% with pain    (Blank rows = not tested)  UPPER EXTREMITY ROM: wfl B   UPPER EXTREMITY MMT: wfl B    CERVICAL SPECIAL TESTS:  Cranial cervical flexion test: Positive, Spurling's test: Positive, and Distraction test: Positive   TREATMENT DATE: 05/27/23:                                                                                                                              Eval,  Manual techniques including PA jt mob T 3 and 4 Manual stretching with contract/relax L upper traps Inst in nags, using towel for B post 1st rib depression, combined with L cervical rotation, with some pain relief noted   PATIENT EDUCATION:  Education details: POC, goals Person educated: Patient Education method: Explanation, Demonstration, Tactile cues, Verbal cues, and Handouts Education comprehension: verbalized understanding, returned demonstration, verbal cues required, and tactile cues required  HOME EXERCISE PROGRAM: Access Code:  [email or web address] URL: [email or web address] Date: 05/27/2023 Prepared by: Melford Tullier  Exercises - Seated Gentle Upper Trapezius Stretch  - 1 x daily - 7 x weekly - 3 sets - 10 reps  ASSESSMENT:  CLINICAL IMPRESSION: Patient is a 43 y.o. female who was evaluated today by physical therapy for L upper traps muscle strain. She presents with consistent pain with palpation C 4 as well as loss of L upper cervical rotation. Also tender, irritable L upper traps.  Responded well today to gentle stretching and to upper thoracic spine mobs.  No strength deficits noted. Her pain is interfering with her sleep and daily life.  She should improve and benefit from skilled PT to address her deficits.   OBJECTIVE IMPAIRMENTS: decreased knowledge of condition, decreased ROM, hypomobility, increased fascial restrictions, impaired UE functional use, postural dysfunction, and pain.   ACTIVITY LIMITATIONS: carrying, lifting, and reach over head  PARTICIPATION LIMITATIONS: cleaning, laundry, and yard work  PERSONAL FACTORS: Behavior pattern, Time since onset of injury/illness/exacerbation, and 1-2 comorbidities: na  are also affecting patient's functional outcome.   REHAB POTENTIAL: Good  CLINICAL DECISION MAKING: Stable/uncomplicated  EVALUATION COMPLEXITY: Low   GOALS: Goals reviewed with patient? Yes  SHORT TERM GOALS: Target date: 2 weeks 06/10/23  I HEP Baseline:  Goal status: INITIAL   LONG TERM GOALS: Target date: 8 weeks, 07/22/23:  NDI improve from 10 / 50 = 20.0 % to 5/50 Baseline:  Goal status: INITIAL  2.  Cervical spine AROM improve to pain free full L rotation and extension Baseline: painful and  limited to 50%  Goal status: INITIAL  3.  Pain free PA movement and thoracic spine extension  Baseline: restricted T 3 to5  Goal status: INITIAL     PLAN:  PT FREQUENCY: 1x/week  PT DURATION: 8 weeks  PLANNED INTERVENTIONS: 97110-Therapeutic exercises, 97530- Therapeutic  activity, [id number]- Neuromuscular re-education, 97535- Self Care, 54098- Manual therapy, 97014- Electrical stimulation (unattended), and 97033- Ionotophoresis 4mg /ml Dexamethasone  PLAN FOR NEXT SESSION: continue with manual stretching, initiate postural musculature strengthening   Crews Mccollam L Amberley Hamler, PT, DPT, OCS 05/27/2023, 5:02 PM

## 2023-05-28 ENCOUNTER — Other Ambulatory Visit: Payer: Self-pay | Admitting: Nurse Practitioner

## 2023-05-28 DIAGNOSIS — R0681 Apnea, not elsewhere classified: Secondary | ICD-10-CM

## 2023-06-02 DIAGNOSIS — R070 Pain in throat: Secondary | ICD-10-CM | POA: Diagnosis not present

## 2023-06-02 DIAGNOSIS — R509 Fever, unspecified: Secondary | ICD-10-CM | POA: Diagnosis not present

## 2023-06-02 DIAGNOSIS — R519 Headache, unspecified: Secondary | ICD-10-CM | POA: Diagnosis not present

## 2023-06-02 DIAGNOSIS — R051 Acute cough: Secondary | ICD-10-CM | POA: Diagnosis not present

## 2023-06-02 DIAGNOSIS — M791 Myalgia, unspecified site: Secondary | ICD-10-CM | POA: Diagnosis not present

## 2023-06-03 ENCOUNTER — Emergency Department (HOSPITAL_COMMUNITY)
Admission: EM | Admit: 2023-06-03 | Discharge: 2023-06-03 | Disposition: A | Discharge status: Home / Self Care | Payer: Medicaid Other | Attending: Emergency Medicine | Admitting: Emergency Medicine

## 2023-06-03 ENCOUNTER — Other Ambulatory Visit: Payer: Self-pay

## 2023-06-03 ENCOUNTER — Ambulatory Visit: Payer: Medicaid Other

## 2023-06-03 DIAGNOSIS — R079 Chest pain, unspecified: Secondary | ICD-10-CM | POA: Insufficient documentation

## 2023-06-03 DIAGNOSIS — M791 Myalgia, unspecified site: Secondary | ICD-10-CM | POA: Diagnosis not present

## 2023-06-03 DIAGNOSIS — J111 Influenza due to unidentified influenza virus with other respiratory manifestations: Secondary | ICD-10-CM | POA: Diagnosis not present

## 2023-06-03 DIAGNOSIS — R059 Cough, unspecified: Secondary | ICD-10-CM | POA: Diagnosis not present

## 2023-06-03 DIAGNOSIS — Z20822 Contact with and (suspected) exposure to covid-19: Secondary | ICD-10-CM | POA: Diagnosis not present

## 2023-06-03 DIAGNOSIS — R0789 Other chest pain: Secondary | ICD-10-CM | POA: Diagnosis not present

## 2023-06-03 DIAGNOSIS — J029 Acute pharyngitis, unspecified: Secondary | ICD-10-CM | POA: Diagnosis not present

## 2023-06-03 DIAGNOSIS — R6889 Other general symptoms and signs: Secondary | ICD-10-CM

## 2023-06-03 LAB — CBC WITH DIFFERENTIAL/PLATELET
Abs Immature Granulocytes: 0.02 10*3/uL (ref 0.00–0.07)
Basophils Absolute: 0.1 10*3/uL (ref 0.0–0.1)
Basophils Relative: 1 %
Eosinophils Absolute: 0.1 10*3/uL (ref 0.0–0.5)
Eosinophils Relative: 1 %
HCT: 37.9 % (ref 36.0–46.0)
Hemoglobin: 12.8 g/dL (ref 12.0–15.0)
Immature Granulocytes: 0 %
Lymphocytes Relative: 22 %
Lymphs Abs: 1.7 10*3/uL (ref 0.7–4.0)
MCH: 29.3 pg (ref 26.0–34.0)
MCHC: 33.8 g/dL (ref 30.0–36.0)
MCV: 86.7 fL (ref 80.0–100.0)
Monocytes Absolute: 0.6 10*3/uL (ref 0.1–1.0)
Monocytes Relative: 7 %
Neutro Abs: 5.5 10*3/uL (ref 1.7–7.7)
Neutrophils Relative %: 69 %
Platelets: 124 10*3/uL — ABNORMAL LOW (ref 150–400)
RBC: 4.37 MIL/uL (ref 3.87–5.11)
RDW: 11.9 % (ref 11.5–15.5)
WBC: 8 10*3/uL (ref 4.0–10.5)
nRBC: 0 % (ref 0.0–0.2)

## 2023-06-03 LAB — BASIC METABOLIC PANEL
Anion gap: 8 (ref 5–15)
BUN: 12 mg/dL (ref 6–20)
CO2: 22 mmol/L (ref 22–32)
Calcium: 8.7 mg/dL — ABNORMAL LOW (ref 8.9–10.3)
Chloride: 104 mmol/L (ref 98–111)
Creatinine, Ser: 0.76 mg/dL (ref 0.44–1.00)
GFR, Estimated: >60 mL/min (ref >60)
Glucose, Bld: 96 mg/dL (ref 70–99)
Potassium: 3.5 mmol/L (ref 3.5–5.1)
Sodium: 134 mmol/L — ABNORMAL LOW (ref 135–145)

## 2023-06-03 LAB — RESP PANEL BY RT-PCR (RSV, FLU A&B, COVID)  RVPGX2
Influenza A by PCR: NEGATIVE
Influenza B by PCR: NEGATIVE
Resp Syncytial Virus by PCR: NEGATIVE
SARS Coronavirus 2 by RT PCR: NEGATIVE

## 2023-06-03 LAB — GROUP A STREP BY PCR: Group A Strep by PCR: DETECTED — AB

## 2023-06-03 LAB — HCG, QUANTITATIVE, PREGNANCY: hCG, Beta Chain, Quant, S: 1 m[IU]/mL (ref <5)

## 2023-06-03 MED ORDER — KETOROLAC TROMETHAMINE 30 MG/ML IJ SOLN
30.0000 mg | Freq: Once | INTRAMUSCULAR | Status: AC
  Administered 2023-06-03: 30 mg via INTRAMUSCULAR
  Filled 2023-06-03: qty 1

## 2023-06-03 MED ORDER — DEXAMETHASONE SODIUM PHOSPHATE 10 MG/ML IJ SOLN
10.0000 mg | Freq: Once | INTRAMUSCULAR | Status: AC
  Administered 2023-06-03: 10 mg via INTRAMUSCULAR
  Filled 2023-06-03: qty 1

## 2023-06-03 NOTE — ED Provider Notes (Signed)
[place] EMERGENCY DEPARTMENT AT [facility] Provider Note   CSN: [id number] Arrival date & time: 06/03/23  0546     History  Chief Complaint  Patient presents with   Generalized Body Aches    Sarya Linenberger is a 43 y.o. female with no reported past medical history who presents the emergency department complaining of sore throat, cough, chest pain, and bodyaches.  Symptoms started yesterday and patient states that she was seen at urgent care.  She states they tested her for COVID which was negative, but did not test her for the flu, although the provider did think it was the flu and discharged her with Tamiflu.  She was not able to pick up the Tamiflu.  She last took Tylenol last night, and a dose of amoxicillin that her father gave her.  She has not taken any other medications.  HPI     Home Medications Prior to Admission medications   Medication Sig Start Date End Date Taking? Authorizing Provider  atorvastatin (LIPITOR) 10 MG tablet Take 1 tablet (10 mg total) by mouth daily. 05/20/23   Anders Simmonds, PA-C  benzonatate (TESSALON) 100 MG capsule Take 1 capsule (100 mg total) by mouth 2 (two) times daily as needed for cough. Patient not taking: Reported on 05/16/2023 06/20/22   Claiborne Rigg, NP  famotidine (PEPCID) 40 MG tablet TAKE 1 TABLET(40 MG) BY MOUTH DAILY Patient not taking: Reported on 05/16/2023 06/05/22   Claiborne Rigg, NP  fluticasone [facility]) 50 MCG/ACT nasal spray Place 2 sprays into both nostrils daily. Patient not taking: Reported on 05/16/2023 08/16/22   Anders Simmonds, PA-C  methocarbamol (ROBAXIN) 500 MG tablet Take 2 tablets (1,000 mg total) by mouth every 8 (eight) hours as needed. 05/16/23   Anders Simmonds, PA-C      Allergies    Patient has no known allergies.    Review of Systems   Review of Systems  Constitutional:  Positive for fatigue. Negative for fever.  HENT:  Positive for sore throat.   Respiratory:  Positive for cough.    Cardiovascular:  Positive for chest pain.  Musculoskeletal:  Positive for myalgias.  All other systems reviewed and are negative.   Physical Exam Updated Vital Signs BP 96/71 (BP Location: Left Arm)   Pulse 73   Temp 98.5 F (36.9 C) (Oral)   Resp 18   Ht 5\' 5"  (1.651 m)   Wt 61.7 kg   SpO2 100%   BMI 22.63 kg/m  Physical Exam Vitals and nursing note reviewed.  Constitutional:      Appearance: Normal appearance.  HENT:     Head: Normocephalic and atraumatic.     Mouth/Throat:     Lips: Pink.     Mouth: Mucous membranes are moist.     Pharynx: Posterior oropharyngeal erythema present.     Tonsils: No tonsillar exudate or tonsillar abscesses. 2+ on the right. 2+ on the left.  Eyes:     Conjunctiva/sclera: Conjunctivae normal.  Cardiovascular:     Rate and Rhythm: Normal rate and regular rhythm.  Pulmonary:     Effort: Pulmonary effort is normal. No respiratory distress.     Breath sounds: Normal breath sounds.  Abdominal:     General: There is no distension.     Palpations: Abdomen is soft.     Tenderness: There is no abdominal tenderness.  Skin:    General: Skin is warm and dry.  Neurological:     General:  No focal deficit present.     Mental Status: She is alert.     ED Results / Procedures / Treatments   Labs (all labs ordered are listed, but only abnormal results are displayed) Labs Reviewed  CBC WITH DIFFERENTIAL/PLATELET - Abnormal; Notable for the following components:      Result Value   Platelets 124 (*)    All other components within normal limits  BASIC METABOLIC PANEL - Abnormal; Notable for the following components:   Sodium 134 (*)    Calcium 8.7 (*)    All other components within normal limits  RESP PANEL BY RT-PCR (RSV, FLU A&B, COVID)  [id number]  GROUP A STREP BY PCR  HCG, QUANTITATIVE, PREGNANCY    EKG None  Radiology No results found.  Procedures Procedures    Medications Ordered in ED Medications  dexamethasone (DECADRON)  injection 10 mg (10 mg Intramuscular Given 06/03/23 1301)  ketorolac (TORADOL) 30 MG/ML injection 30 mg (30 mg Intramuscular Given 06/03/23 1301)    ED Course/ Medical Decision Making/ A&P                                 Medical Decision Making Amount and/or Complexity of Data Reviewed Labs: ordered.  This patient is a 43 y.o. female who presents to the ED for concern of sore throat, cough, body aches, chest pain.   Differential diagnoses prior to evaluation: The emergent differential diagnosis includes, but is not limited to,  upper respiratory infection, lower respiratory infection, allergies, asthma, irritants, sinus/esophageal foreign body, medications, reflux, interstitial lung disease, postnasal drip, viral illness, sepsis. This is not an exhaustive differential.   Past Medical History / Co-morbidities / Additional history: Chart reviewed. Pertinent results include: no significant PMH. Cannot see urgent care visit from yesterday.   Physical Exam: Physical exam performed. The pertinent findings include: Normal vital signs, no acute distress.  Bilateral 2+ tonsillar swelling with pharyngeal erythema, no exudate or abscess.  Tolerating own secretions.  Lung sounds clear, normal respiratory effort, normal spO2 on room air.  Lab Tests/Imaging studies: I personally interpreted labs/imaging and the pertinent results include: CBC and BMP unremarkable.  hCG quant, respiratory panel, group A strep PCR all pending at time of discharge.  As patient is clinically well-appearing, normal vital signs, with reassuring laboratory evaluation and no evidence of focal consolidation on lung auscultation, do not feel she is needing a chest x-ray emergently.   Cardiac monitoring: EKG obtained and interpreted by my attending physician which shows: Normal sinus rhythm   Medications: I ordered medication including decadron and toradol.  I have reviewed the patients home medicines and have made adjustments  as needed.   Disposition: After consideration of the diagnostic results and the patients response to treatment, I feel that emergency department workup does not suggest an emergent condition requiring admission or immediate intervention beyond what has been performed at this time. Patient with symptoms consistent with influenza or other viral illness.  Vitals are stable. No signs of dehydration, tolerating PO's.  Lungs are clear.   The plan is: Patient will be discharged with instructions to orally hydrate, rest, and use over-the-counter medications such as anti-inflammatories such as ibuprofen and Tylenol for fever.  Will call patient if any testing is positive and prescribe medication if needed. Recommend OTC meds and symptomatic management at home. The patient is safe for discharge and has been instructed to return immediately for worsening symptoms, change  in symptoms or any other concerns.  Final Clinical Impression(s) / ED Diagnoses Final diagnoses:  Sore throat  Flu-like symptoms    Rx / DC Orders ED Discharge Orders     None      Portions of this report may have been transcribed using voice recognition software. Every effort was made to ensure accuracy; however, inadvertent computerized transcription errors may be present.    Jeanella Flattery 06/03/23 1320    Ernie Avena, MD 06/04/23 [id number]

## 2023-06-03 NOTE — Discharge Instructions (Addendum)
You were seen in the ER for sore throat, body aches, and chest pain.  We tested you for flu, COVID, RSV, and strep throat. These results are still pending. I will call you later today or early tomorrow if any of your testing is positive and if there is medicine that needs to be called in for you.  Please use acetaminophen (Tylenol) or ibuprofen (Advil, Motrin) for pain.  You may use 800 mg ibuprofen every 6 hours or 1000 mg of acetaminophen every 6 hours.  You may choose to alternate between the two, this would be most effective. Do not exceed 4000 mg of acetaminophen within 24 hours.  Do not exceed 3200 mg ibuprofen within 24 hours.  Make sure you are drinking lots of fluids and getting lots of rest. Return to the ER for new or worsening symptoms such as more difficulty breathing, fever despite medication, or inability to swallow.

## 2023-06-03 NOTE — ED Notes (Signed)
Awaiting from lobby

## 2023-06-03 NOTE — ED Triage Notes (Signed)
Patient presents to ed c/o generalized bodyaches was seen at urgent care yest and told she had the flu was started on tamaflu.

## 2023-06-03 NOTE — ED Notes (Signed)
Pt left in no new onset distress, vitals stable, waiting for husband in lobby.

## 2023-06-04 ENCOUNTER — Telehealth (HOSPITAL_COMMUNITY): Payer: Self-pay | Admitting: Emergency Medicine

## 2023-06-04 DIAGNOSIS — J020 Streptococcal pharyngitis: Secondary | ICD-10-CM

## 2023-06-04 MED ORDER — AMOXICILLIN 500 MG PO CAPS: 500.0000 mg | ORAL_CAPSULE | Freq: Two times a day (BID) | ORAL | 0 refills | Status: AC

## 2023-06-04 NOTE — Telephone Encounter (Cosign Needed)
Called patient to make aware of strep positive results. Pt confirmed name and DOB. Given results and antibiotic sent to pharmacy.

## 2023-06-10 ENCOUNTER — Other Ambulatory Visit: Payer: Self-pay

## 2023-06-10 ENCOUNTER — Ambulatory Visit: Payer: Medicaid Other | Attending: Physician Assistant

## 2023-06-10 DIAGNOSIS — M436 Torticollis: Secondary | ICD-10-CM | POA: Insufficient documentation

## 2023-06-10 DIAGNOSIS — R293 Abnormal posture: Secondary | ICD-10-CM | POA: Diagnosis not present

## 2023-06-10 DIAGNOSIS — M62838 Other muscle spasm: Secondary | ICD-10-CM | POA: Insufficient documentation

## 2023-06-10 NOTE — Therapy (Signed)
OUTPATIENT PHYSICAL THERAPY CERVICAL TREATMENT   Patient Name: Laura Warren MRN: [id number] DOB:11/26/1980, 43 y.o., female Today's Date: 06/10/2023  END OF SESSION:  PT End of Session - 06/10/23 1740     Visit Number 2    Date for PT Re-Evaluation 07/22/23    Progress Note Due on Visit 10    PT Start Time 1600    PT Stop Time 1640    PT Time Calculation (min) 40 min    Activity Tolerance Patient tolerated treatment well    Behavior During Therapy [facility] for tasks assessed/performed              Past Medical History:  Diagnosis Date   Medical history non-contributory    Past Surgical History:  Procedure Laterality Date   APPENDECTOMY     Patient Active Problem List   Diagnosis Date Noted   Bacterial vaginosis 12/09/2019    PCP: Bertram Denver, NP  REFERRING PROVIDER: Anders Simmonds   REFERRING DIAG: L trapezius muscle spasm  THERAPY DIAG:  Stiffness of cervical spine  Posture imbalance  Other muscle spasm  Rationale for Evaluation and Treatment: Rehabilitation  ONSET DATE: 3 month history  SUBJECTIVE:                                                                                                                                                                                                         SUBJECTIVE STATEMENT: Reports had significant pain for 2 days after initial session.  Then developed flu and pneumonia. Still pain with L neck turning and with extension.  Taking lots of different meds due to the pneumonia and flu  Hand dominance: Right  PERTINENT HISTORY:  Referred by PCP  PAIN:  Are you having pain? Yes: NPRS scale: 5 Pain location: L upper traps, L parietal region head Pain description: deep pain locks with turning head L Aggravating factors: turning head L , trying to lift heavier items Relieving factors: mild with tylenol  PRECAUTIONS: None  RED FLAGS: None     WEIGHT BEARING RESTRICTIONS: No  FALLS:  Has patient fallen  in last 6 months? No  LIVING ENVIRONMENT: Lives with: lives with their family Lives in: House/apartment Stairs: flight indoors Has following equipment at home: na  OCCUPATION: interpreter  PLOF: Independent  PATIENT GOALS: be able to sleep, be able to turn head  NEXT MD VISIT: not scheduled  OBJECTIVE:  Note: Objective measures were completed at Evaluation unless otherwise noted.  DIAGNOSTIC FINDINGS:  none  PATIENT SURVEYS:  NDI Neck Disability Index score: 10 /  50 = 20.0 %  COGNITION: Overall cognitive status: Within functional limits for tasks assessed  SENSATION: WFL  POSTURE: rounded shoulders, increased lumbar lordosis, and increased thoracic kyphosis  PALPATION: Hypersensitive L upper traps, levator, L supraspinatus, and L paraspinals C 4/5, also decreased mobility T 3,4,5 and L post ribs   CERVICAL ROM:   Active ROM A/PROM (deg) eval  Flexion wfl  Extension 50% with hard stop and R cer rotation at end range  Right lateral flexion   Left lateral flexion   Right rotation 75% with pain  Left rotation 50% with pain    (Blank rows = not tested)  UPPER EXTREMITY ROM: wfl B   UPPER EXTREMITY MMT: wfl B    CERVICAL SPECIAL TESTS:  Cranial cervical flexion test: Positive, Spurling's test: Positive, and Distraction test: Positive   TREATMENT DATE:  06/10/23:  L rotation with pain at 80 degrees Palpation L C2 and C 4, also Cervicothoracic jxn and T 2 with pain Manual techniques, supine with moist head under thoracic region, manual stretching in supine for levator, upper traps, cervical spine retractors, combined with contract/relax, 5 sec holds Supine flex/ rot stretch to L with R rotation contract/relax  PA pressure in supine T2 with L cervical rotation, pain free, repeated 10 reps Intervals of manual cervical traction  Seated for myofascial release and massage B upper traps, levator  05/27/23:                                                                                                                               Eval,  Manual techniques including PA jt mob T 3 and 4 Manual stretching with contract/relax L upper traps Inst in nags, using towel for B post 1st rib depression, combined with L cervical rotation, with some pain relief noted   PATIENT EDUCATION:  Education details: POC, goals Person educated: Patient Education method: Explanation, Demonstration, Tactile cues, Verbal cues, and Handouts Education comprehension: verbalized understanding, returned demonstration, verbal cues required, and tactile cues required  HOME EXERCISE PROGRAM: Access Code: [email or web address] URL: [email or web address] Date: 05/27/2023 Prepared by: Avian Konigsberg  Exercises - Seated Gentle Upper Trapezius Stretch  - 1 x daily - 7 x weekly - 3 sets - 10 reps  ASSESSMENT:  CLINICAL IMPRESSION: Patient is a 43 y.o. female who was evaluated today by physical therapy for L upper traps muscle strain. She continues to present with consistent pain with palpation C 4 as well as loss of L upper cervical rotation.very gentle today with manual techniques, due to her report of increased pain after initial session.  Recommend further instruction in therex to engage scapular retractors next visit. She should improve and benefit from skilled PT to address her deficits.   OBJECTIVE IMPAIRMENTS: decreased knowledge of condition, decreased ROM, hypomobility, increased fascial restrictions, impaired UE functional use, postural dysfunction, and pain.   ACTIVITY LIMITATIONS: carrying, lifting, and reach over head  PARTICIPATION LIMITATIONS: cleaning, laundry, and yard work  PERSONAL FACTORS: Behavior pattern, Time since onset of injury/illness/exacerbation, and 1-2 comorbidities: na  are also affecting patient's functional outcome.   REHAB POTENTIAL: Good  CLINICAL DECISION MAKING: Stable/uncomplicated  EVALUATION COMPLEXITY: Low   GOALS: Goals reviewed with  patient? Yes  SHORT TERM GOALS: Target date: 2 weeks 06/10/23  I HEP Baseline:  Goal status: INITIAL   LONG TERM GOALS: Target date: 8 weeks, 07/22/23:  NDI improve from 10 / 50 = 20.0 % to 5/50 Baseline:  Goal status: INITIAL  2.  Cervical spine AROM improve to pain free full L rotation and extension Baseline: painful and limited to 50%  Goal status: INITIAL  3.  Pain free PA movement and thoracic spine extension  Baseline: restricted T 3 to5  Goal status: INITIAL     PLAN:  PT FREQUENCY: 1x/week  PT DURATION: 8 weeks  PLANNED INTERVENTIONS: 97110-Therapeutic exercises, 97530- Therapeutic activity, [id number]- Neuromuscular re-education, 97535- Self Care, 56433- Manual therapy, 97014- Electrical stimulation (unattended), and 97033- Ionotophoresis 4mg /ml Dexamethasone  PLAN FOR NEXT SESSION: continue with manual stretching, initiate postural musculature strengthening   Viral Schramm L Christopherjohn Schiele, PT, DPT, OCS 06/10/2023, 5:52 PM

## 2023-06-17 ENCOUNTER — Ambulatory Visit: Payer: Medicaid Other | Admitting: Physical Therapy

## 2023-06-18 ENCOUNTER — Ambulatory Visit: Payer: Medicaid Other | Admitting: Physical Therapy

## 2023-06-24 ENCOUNTER — Ambulatory Visit: Payer: Medicaid Other | Admitting: Physical Therapy

## 2023-07-01 ENCOUNTER — Ambulatory Visit: Payer: Medicaid Other

## 2023-08-07 ENCOUNTER — Ambulatory Visit (HOSPITAL_BASED_OUTPATIENT_CLINIC_OR_DEPARTMENT_OTHER): Payer: Medicaid Other | Attending: Nurse Practitioner | Admitting: Internal Medicine

## 2023-08-08 ENCOUNTER — Ambulatory Visit: Payer: Self-pay

## 2023-08-08 NOTE — Telephone Encounter (Signed)
 Noted.

## 2023-08-08 NOTE — Telephone Encounter (Signed)
 Copied from CRM [id number]. Topic: Clinical - Red Word Triage >> Aug 08, 2023 10:14 AM Geroge Baseman wrote: Red Word that prompted transfer to Nurse Triage: Shortness of breath, bad cough, having extreme chills. Onset about 3 days, taking OTC meds not seem to work. Yellow mucous.  Chief Complaint: cough with sob, chills Symptoms: cough, chills, worse at night, can't sleep Frequency: for 3 days Pertinent Negatives: Patient denies fever, cp, sob without cough Disposition: [] ED /[x] Urgent Care (no appt availability in office) / [] Appointment(In office/virtual)/ []  [place] [facility]/ [] Home Care/ [] Refused Recommended Disposition /[] [place] [facility]/ []  Follow-up with PCP Additional Notes: no apts available for today; instructed to go to UC.  Care advice given, denies questions; instructed to go to ER if becomes worse.   Reason for Disposition  [1] MILD difficulty breathing (e.g., minimal/no SOB at rest, SOB with walking, pulse <100) AND [2] NEW-onset or WORSE than normal  Answer Assessment - Initial Assessment Questions 1. RESPIRATORY STATUS: "Describe your breathing?" (e.g., wheezing, shortness of breath, unable to speak, severe coughing)      Cough, sob when she coughs with chest discomfort 2. ONSET: "When did this breathing problem begin?"      3 days 3. PATTERN "Does the difficult breathing come and go, or has it been constant since it started?"      Only with cough 4. SEVERITY: "How bad is your breathing?" (e.g., mild, moderate, severe)    - MILD: No SOB at rest, mild SOB with walking, speaks normally in sentences, can lie down, no retractions, pulse < 100.    - MODERATE: SOB at rest, SOB with minimal exertion and prefers to sit, cannot lie down flat, speaks in phrases, mild retractions, audible wheezing, pulse 100-120.    - SEVERE: Very SOB at rest, speaks in single words, struggling to breathe, sitting hunched forward, retractions, pulse > 120      moderate 5. RECURRENT SYMPTOM:  "Have you had difficulty breathing before?" If Yes, ask: "When was the last time?" and "What happened that time?"      denies 6. CARDIAC HISTORY: "Do you have any history of heart disease?" (e.g., heart attack, angina, bypass surgery, angioplasty)      no 7. LUNG HISTORY: "Do you have any history of lung disease?"  (e.g., pulmonary embolus, asthma, emphysema)     no 8. CAUSE: "What do you think is causing the breathing problem?"      cold 9. OTHER SYMPTOMS: "Do you have any other symptoms? (e.g., dizziness, runny nose, cough, chest pain, fever)     Cough, chills 10. O2 SATURATION MONITOR:  "Do you use an oxygen saturation monitor (pulse oximeter) at home?" If Yes, ask: "What is your reading (oxygen level) today?" "What is your usual oxygen saturation reading?" (e.g., 95%)       Yes, has not used it 11. PREGNANCY: "Is there any chance you are pregnant?" "When was your last menstrual period?"       no 12. TRAVEL: "Have you traveled out of the country in the last month?" (e.g., travel history, exposures)       no  Protocols used: Breathing Difficulty-A-AH

## 2023-08-19 ENCOUNTER — Ambulatory Visit: Payer: Self-pay | Admitting: Physician Assistant

## 2023-08-28 ENCOUNTER — Ambulatory Visit: Attending: Physician Assistant | Admitting: Physician Assistant

## 2023-08-28 VITALS — BP 98/68 | HR 77 | Temp 98.1°F | Ht 65.0 in | Wt 139.0 lb

## 2023-08-28 DIAGNOSIS — E7800 Pure hypercholesterolemia, unspecified: Secondary | ICD-10-CM

## 2023-08-28 DIAGNOSIS — J069 Acute upper respiratory infection, unspecified: Secondary | ICD-10-CM

## 2023-08-28 MED ORDER — FLUTICASONE PROPIONATE 50 MCG/ACT NA SUSP: 2.0000 | Freq: Every day | NASAL | 6 refills | Status: AC

## 2023-08-28 MED ORDER — AZITHROMYCIN 250 MG PO TABS: ORAL_TABLET | ORAL | 0 refills | Status: AC

## 2023-08-28 MED ORDER — BENZONATATE 100 MG PO CAPS: 100.0000 mg | ORAL_CAPSULE | Freq: Two times a day (BID) | ORAL | 0 refills | Status: AC | PRN

## 2023-08-28 NOTE — Progress Notes (Signed)
 Patient ID: Laura Warren, female   DOB: 04/08/1981, 43 y.o.   MRN: [id number]     Laura Warren, is a 43 y.o. female  WGN:[id number]  HQI:[id number]  DOB - [birth year]  Chief Complaint  Patient presents with   Shortness of Breath    SOB, watery eyes congestion X3-4 days  Returning cough X3 weeks        Subjective:   Laura Warren is a 43 y.o. female here today for URI s/sx for 3 weeks.  Initially she had some chills.  She did not take temp.  + cough with SOB.  No wheezing.  Mucus is yellow.  Currently not using any allergy meds.  Pollen is heavy right now.  +sinus pressure.  Cough is worse at night but also throughout the day.  Mild ST and ears are congested.  Eyes itchy.  Started on cholesterol meds a few months ago.  Wants to recheck lipids.  She is fasting    No problems updated.  ALLERGIES: No Known Allergies  PAST MEDICAL HISTORY: Past Medical History:  Diagnosis Date   Medical history non-contributory     MEDICATIONS AT HOME: Prior to Admission medications   Medication Sig Start Date End Date Taking? Authorizing Provider  atorvastatin  (LIPITOR) 10 MG tablet Take 1 tablet (10 mg total) by mouth daily. 05/20/23  Yes Dulce Gibbs M, PA-C  azithromycin  (ZITHROMAX ) 250 MG tablet Take 2 tablets on day 1, then 1 tablet daily on days 2 through 5 08/28/23 09/02/23 Yes Kaydan Wilhoite M, PA-C  methocarbamol  (ROBAXIN ) 500 MG tablet Take 2 tablets (1,000 mg total) by mouth every 8 (eight) hours as needed. 05/16/23  Yes Hassie Lint, PA-C  benzonatate  (TESSALON ) 100 MG capsule Take 1 capsule (100 mg total) by mouth 2 (two) times daily as needed for cough. 08/28/23   Hassie Lint, PA-C  famotidine  (PEPCID ) 40 MG tablet TAKE 1 TABLET(40 MG) BY MOUTH DAILY Patient not taking: Reported on 08/28/2023 06/05/22   Fleming, Zelda W, NP  fluticasone  (FLONASE ) 50 MCG/ACT nasal spray Place 2 sprays into both nostrils daily. 08/28/23   Tanaiya Kolarik, Stan Eans, PA-C    ROS: Neg  HEENT Neg resp Neg cardiac Neg GI Neg GU Neg MS Neg psych Neg neuro  Objective:   Vitals:   08/28/23 0841  BP: 98/68  Pulse: 77  Temp: 98.1 F (36.7 C)  TempSrc: Oral  SpO2: 99%  Weight: 139 lb (63 kg)  Height: 5\' 5"  (1.651 m)   Exam General appearance : Awake, alert, not in any distress. Speech Clear. Not toxic looking HEENT: Atraumatic and Normocephalic, pupils equally reactive to light and accomodation.  B TM congested.  Throat with PND.   Neck: Supple, no JVD. No cervical lymphadenopathy.  Chest: Good air entry bilaterally, CTAB.  No rales/rhonchi/wheezing CVS: S1 S2 regular, no murmurs.  Extremities: B/L Lower Ext shows no edema, both legs are warm to touch Neurology: Awake alert, and oriented X 3, CN II-XII intact, Non focal Skin: No Rash  Data Review No results found for: "HGBA1C"  Assessment & Plan   1. Viral URI with cough Will cover for atypicals and treat allergies which likely precipitated illness - fluticasone  (FLONASE ) 50 MCG/ACT nasal spray; Place 2 sprays into both nostrils daily.  Dispense: 16 g; Refill: 6 - azithromycin  (ZITHROMAX ) 250 MG tablet; Take 2 tablets on day 1, then 1 tablet daily on days 2 through 5  Dispense: 6 tablet; Refill: 0 - benzonatate  (TESSALON ) 100 MG capsule; Take 1  capsule (100 mg total) by mouth 2 (two) times daily as needed for cough.  Dispense: 40 capsule; Refill: 0  2. Elevated LDL cholesterol level (Primary) Continue statin - Lipid Panel - Hepatic Function Panel    Return if symptoms worsen or fail to improve, for PCP for chronic conditions.  The patient was given clear instructions to go to ER or return to medical center if symptoms don't improve, worsen or new problems develop. The patient verbalized understanding. The patient was told to call to get lab results if they haven't heard anything in the next week.      Dulce Gibbs, PA-C [facility] [place],  [place] [phone]   08/28/2023, 8:57 AM

## 2023-08-28 NOTE — Patient Instructions (Signed)

## 2023-08-29 ENCOUNTER — Encounter: Payer: Self-pay | Admitting: Physician Assistant

## 2023-08-29 LAB — HEPATIC FUNCTION PANEL
ALT: 14 IU/L (ref 0–32)
AST: 19 IU/L (ref 0–40)
Albumin: 4.3 g/dL (ref 3.9–4.9)
Alkaline Phosphatase: 35 IU/L — ABNORMAL LOW (ref 44–121)
Bilirubin Total: 0.8 mg/dL (ref 0.0–1.2)
Bilirubin, Direct: 0.21 mg/dL (ref 0.00–0.40)
Total Protein: 6.5 g/dL (ref 6.0–8.5)

## 2023-08-29 LAB — LIPID PANEL
Chol/HDL Ratio: 4.1 ratio (ref 0.0–4.4)
Cholesterol, Total: 194 mg/dL (ref 100–199)
HDL: 47 mg/dL (ref >39)
LDL Chol Calc (NIH): 125 mg/dL — ABNORMAL HIGH (ref 0–99)
Triglycerides: 122 mg/dL (ref 0–149)
VLDL Cholesterol Cal: 22 mg/dL (ref 5–40)

## 2023-09-20 ENCOUNTER — Ambulatory Visit: Payer: Self-pay

## 2023-09-20 NOTE — Telephone Encounter (Signed)
 Spoke with patient . Patient will go to the mobile unit Tuesday. Advise patient if her S/S/  worsen to go the the UC or ed. Patient is agreeable with plan.

## 2023-09-20 NOTE — Telephone Encounter (Signed)
 Copied from CRM [id number]. Topic: Clinical - Red Word Triage >> Sep 20, 2023 10:32 AM Laura Warren D wrote: Patient is having tingling in right hand, swelling and a lot of pain for a week and it's getting worse. Lower abdomin pain.   Chief Complaint: Hand swelling  Symptoms: Right hand swelling, right hand pain, right hand tingling, lower abdominal pain  Disposition: [] ED /[x] Urgent Care (no appt availability in office) / [] Appointment(In office/virtual)/ []  [facility]/ [] Home Care/ [] Refused Recommended Disposition /[] [facility]/ []  Follow-up with PCP Additional Notes: Patient reports that for the last week she has been experiencing mild swelling of her right hand. She states that with her swelling she has been experiencing pain and tingling of her hand, and reports that the skin near her fingernails is slightly blue. She states she has also been experiencing intermittent lower abdominal pain. Patient advised to go to urgent care due to there not being an appointment in the office. Patient verbalized understanding of this plan.   Patient also reports that she is going to be travelling next month and would like to come in for a physical before 10/16/23 and would like to be informed if there is an opening before that time. Patient states she will also call next week to check on appointment availability.    Reason for Disposition  [1] MILD swelling (puffiness) of both hands AND [2] not better after 3 days    Only one hand  Answer Assessment - Initial Assessment Questions 1. ONSET: "When did the swelling start?" (e.g., minutes, hours, days)     A couple of weeks 2. LOCATION: "What part of the hand is swollen?"  "Are both hands swollen or just one hand?"     Right hand  3. SEVERITY: "How bad is the swelling?" (e.g., localized; mild, moderate, severe)   - BALL OR LUMP: small ball or lump   - LOCALIZED: puffy or swollen area or patch of skin   - JOINT SWELLING: swelling of a  joint   - MILD: puffiness or mild swelling of fingers or hand   - MODERATE: fingers and hand are swollen   - SEVERE: swelling of entire hand and up into forearm     Mild  4. REDNESS: "Does the swelling look red or infected?"     Mild 5. PAIN: "Is the swelling painful to touch?" If Yes, ask: "How painful is it?"   (Scale 1-10; mild, moderate or severe)     8/10 6. FEVER: "Do you have a fever?" If Yes, ask: "What is it, how was it measured, and when did it start?"      No 7. CAUSE: "What do you think is causing the hand swelling?" (e.g., heat, insect bite, pregnancy, recent injury)     Unsure  8. MEDICAL HISTORY: "Do you have a history of heart failure, kidney disease, liver failure, or cancer?"     No 9. RECURRENT SYMPTOM: "Have you had hand swelling before?" If Yes, ask: "When was the last time?" "What happened that time?"     No 10. OTHER SYMPTOMS: "Do you have any other symptoms?" (e.g., blurred vision, difficulty breathing, headache)       Tingling of hand, bluish color around nails, lower abdominal pain 11. PREGNANCY: "Is there any chance you are pregnant?" "When was your last menstrual period?"       No  Protocols used: Hand Swelling-A-AH

## 2023-09-24 ENCOUNTER — Encounter: Payer: Self-pay | Admitting: Physician Assistant

## 2023-09-24 ENCOUNTER — Ambulatory Visit: Admitting: Physician Assistant

## 2023-09-24 VITALS — BP 111/77 | HR 75 | Ht 65.0 in | Wt 137.0 lb

## 2023-09-24 DIAGNOSIS — D696 Thrombocytopenia, unspecified: Secondary | ICD-10-CM

## 2023-09-24 DIAGNOSIS — L03011 Cellulitis of right finger: Secondary | ICD-10-CM

## 2023-09-24 DIAGNOSIS — Z1231 Encounter for screening mammogram for malignant neoplasm of breast: Secondary | ICD-10-CM

## 2023-09-24 DIAGNOSIS — L03012 Cellulitis of left finger: Secondary | ICD-10-CM | POA: Diagnosis not present

## 2023-09-24 DIAGNOSIS — R110 Nausea: Secondary | ICD-10-CM | POA: Diagnosis not present

## 2023-09-24 MED ORDER — ONDANSETRON 4 MG PO TBDP: 4.0000 mg | ORAL_TABLET | Freq: Three times a day (TID) | ORAL | 0 refills | Status: AC | PRN

## 2023-09-24 MED ORDER — DOXYCYCLINE HYCLATE 100 MG PO CAPS: 100.0000 mg | ORAL_CAPSULE | Freq: Two times a day (BID) | ORAL | 0 refills | Status: AC

## 2023-09-24 NOTE — Patient Instructions (Addendum)
 VISIT SUMMARY:  Today, we addressed your nail infection, nausea, low platelet count, and hyperlipidemia. We discussed your upcoming travel plans and how to manage your symptoms during the trip.  YOUR PLAN:  -NAIL INFECTION: A nail infection is an infection of the nail bed, often caused by bacteria or fungi. You have a nail infection on your right thumb, likely from artificial nails. We have prescribed doxycycline  100 mg to be taken twice daily for 10 days. Please take a picture of your nail beds for our medical records.  -NAUSEA: Nausea is a feeling of sickness with an inclination to vomit. You have been experiencing intermittent nausea without vomiting or diarrhea. We have prescribed Zofran  to help manage your symptoms. Additionally, we recommend taking an over-the-counter probiotic while you are on antibiotics.  -LOW PLATELET COUNT: A low platelet count means you have fewer platelets in your blood than normal, which can affect blood clotting. We noted a slightly low platelet count. We will recheck your platelet levels and assess your thyroid  function with lab tests.  -HYPERLIPIDEMIA: Hyperlipidemia is a condition where there are high levels of fats (lipids) in the blood, particularly LDL cholesterol. Your LDL cholesterol is elevated, and we recommend continuing with Lipitor to manage your levels. We also advise following a low-fat diet and regular exercise. We will recheck your cholesterol in six months.  Referral for mammogram was created on your behalf as well.  Nausea, Adult Nausea is the feeling of having an upset stomach or that you are about to vomit. Nausea on its own is not usually a serious concern, but it may be an early sign of a more serious medical problem. As nausea gets worse, it can lead to vomiting. If vomiting develops, or if you are not able to drink enough fluids, you are at risk of becoming dehydrated. Dehydration can make you tired and thirsty, cause you to have a dry mouth,  and decrease how often you urinate. Older adults and people with other diseases or a weak disease-fighting system (immune system) are at higher risk for dehydration. The main goals of treating your nausea are: To relieve your nausea. To limit repeated nausea episodes. To prevent vomiting and dehydration. Follow these instructions at home: Watch your symptoms for any changes. Tell your health care provider about them. Eating and drinking     Take an oral rehydration solution (ORS). This is a drink that is sold at pharmacies and retail stores. Drink clear fluids slowly and in small amounts as you are able. Clear fluids include water, ice chips, low-calorie sports drinks, and fruit juice that has water added (diluted fruit juice). Eat bland, easy-to-digest foods in small amounts as you are able. These foods include bananas, applesauce, rice, lean meats, toast, and crackers. Avoid drinking fluids that contain a lot of sugar or caffeine, such as energy drinks, sports drinks, and soda. Avoid alcohol. Avoid spicy or fatty foods. General instructions Take over-the-counter and prescription medicines only as told by your health care provider. Rest at home while you recover. Drink enough fluid to keep your urine pale yellow. Breathe slowly and deeply when you feel nauseous. Avoid smelling things that have strong odors. Wash your hands often using soap and water for at least 20 seconds. If soap and water are not available, use hand sanitizer. Make sure that everyone in your household washes their hands well and often. Keep all follow-up visits. This is important. Contact a health care provider if: Your nausea gets worse. Your nausea does  not go away after two days. You vomit multiple times. You cannot drink fluids without vomiting. You have any of the following: New symptoms. A fever. A headache. Muscle cramps. A rash. Pain while urinating. You feel light-headed or dizzy. Get help right  away if: You have pain in your chest, neck, arm, or jaw. You feel extremely weak or you faint. You have vomit that is bright red or looks like coffee grounds. You have bloody or black stools (feces) or stools that look like tar. You have a severe headache, a stiff neck, or both. You have severe pain, cramping, or bloating in your abdomen. You have difficulty breathing or are breathing very quickly. Your heart is beating very quickly. Your skin feels cold and clammy. You feel confused. You have signs of dehydration, such as: Dark urine, very little urine, or no urine. Cracked lips. Dry mouth. Sunken eyes. Sleepiness. Weakness. These symptoms may be an emergency. Get help right away. Call 911. Do not wait to see if the symptoms will go away. Do not drive yourself to the hospital. Summary Nausea is the feeling that you have an upset stomach or that you are about to vomit. Nausea on its own is not usually a serious concern, but it may be an early sign of a more serious medical problem. If vomiting develops, or if you are not able to drink enough fluids, you are at risk of becoming dehydrated. Follow recommendations for eating and drinking and take over-the-counter and prescription medicines only as told by your health care provider. Contact a health care provider right away if your symptoms worsen or you have new symptoms. Keep all follow-up visits. This is important. This information is not intended to replace advice given to you by your health care provider. Make sure you discuss any questions you have with your health care provider. Document Revised: 10/28/2020 Document Reviewed: 10/28/2020 Elsevier Patient Education  2024 [employer] Inc[employer]  Cellulitis, Adult  Cellulitis is a skin infection. The infected area is usually warm, red, swollen, and tender. It most commonly occurs on the lower body, such as the legs, feet, and toes, but this condition can occur on any part of the body. The  infection can travel to the muscles, blood, and underlying tissue and become life-threatening without treatment. It is important to get medical treatment right away for this condition. What are the causes? Cellulitis is caused by bacteria. The bacteria enter through a break in the skin, such as a cut, burn, insect or animal bite, open sore, or crack. What increases the risk? This condition is more likely to occur in people who: Have a weak body's defense system (immune system). Are older than 43 years old. Have diabetes. Have a type of long-term (chronic) liver disease (cirrhosis) or kidney disease. Are obese. Have a skin condition such as: An itchy rash, such as eczema or psoriasis. A fungal rash on the feet or in skinfolds. Blistering rashes, such as shingles or chickenpox. Slow movement of blood in the veins (venous stasis). Fluid buildup below the skin (edema). Have open wounds on the skin, such as cuts, puncture wounds, burns, bites, scrapes, tattoos, piercings, or wounds from surgery. Have had radiation therapy. Use IV drugs. What are the signs or symptoms? Symptoms of this condition include: Skin that looks red, purple, or slightly darker than your usual skin color. Streaks or spots on the skin. Swollen area of the skin. Tenderness or pain when an area of the skin is touched. Warm skin.  Fever or chills. Blisters. Tiredness (fatigue). How is this diagnosed? This condition is diagnosed based on a medical history and physical exam. You may also have tests, including: Blood tests. Imaging tests. Tests on a sample of fluid taken from the wound (wound culture). How is this treated? Treatment for this condition may include: Medicines. These may include antibiotics or medicines to treat allergies (antihistamines). Rest. Applying cold or warm wet cloths (compresses) to the skin. If the condition is severe, you may need to stay in the hospital and get antibiotics through an  IV. The infection usually starts to get better within 1-2 days of treatment. Follow these instructions at home: Medicines Take over-the-counter and prescription medicines only as told by your health care provider. If you were prescribed antibiotics, take them as told by your provider. Do not stop using the antibiotic even if you start to feel better. General instructions Drink enough fluid to keep your pee (urine) pale yellow. Do not touch or rub the infected area. Raise (elevate) the infected area above the level of your heart while you are sitting or lying down. Return to your normal activities as told by your provider. Ask your provider what activities are safe for you. Apply warm or cold compresses to the affected area as told by your provider. Keep all follow-up visits. Your provider will need to make sure that a more serious infection is not developing. Contact a health care provider if: You have a fever. Your symptoms do not improve within 1-2 days of starting treatment or you develop new symptoms. Your bone or joint underneath the infected area becomes painful after the skin has healed. Your infection returns in the same area or another area. Signs of this may include: You notice a swollen bump in the infected area. Your red area gets larger, turns dark in color, or becomes more painful. Drainage increases. Pus or a bad smell develops in your infected area. You have more pain. You feel ill and have muscle aches and weakness. You develop vomiting or diarrhea that will not go away. Get help right away if: You notice red streaks coming from the infected area. You notice the skin turns purple or black and falls off. This symptom may be an emergency. Get help right away. Call 911. Do not wait to see if the symptom will go away. Do not drive yourself to the hospital. This information is not intended to replace advice given to you by your health care provider. Make sure you discuss  any questions you have with your health care provider. Document Revised: 12/19/2021 Document Reviewed: 12/19/2021 Elsevier Patient Education  2024 [employer].

## 2023-09-24 NOTE — Progress Notes (Signed)
 Established Patient Office Visit  Subjective   Patient ID: Laura Warren, female    DOB: 19-Nov-1980  Age: 44 y.o. MRN: [id number]  Chief Complaint  Patient presents with   Nail Problem    Nail infection started approximately 1 month ago. Left middle finger, and right thumb. Patient had on artifical nails at the time.      Nausea    Started 1 week ago.    Discussed the use of AI scribe software for clinical note transcription with the patient, who gave verbal consent to proceed.  History of Present Illness   Laura Warren is a 43 year old female who presents with a nail infection and nausea.  She has had a nail infection on her right thumb and left middle finger for one month, initially causing significant pain and swelling, which has slightly subsided. She manages the condition with warm water soaks and occasional application of Vaseline at night.  She has been experiencing intermittent nausea without vomiting or diarrhea for the past week and maintains a normal diet and fluid intake. No specific treatments have been attempted for the nausea. Does endorse history of frequent lower abdominal pain, states that she was experiencing some lower abdominal pain this past week as well until yesterday, pain is not present today.  Denies any new medications.  She is planning to travel to [place] next month and is concerned about managing her symptoms before her trip. She experiences ear pain during long flights.     Past Medical History:  Diagnosis Date   Medical history non-contributory    Social History   Socioeconomic History   Marital status: Married    Spouse name: Deepak   Number of children: 2   Years of education: Not on file   Highest education level: Not on file  Occupational History   Not on file  Tobacco Use   Smoking status: Never   Smokeless tobacco: Never  Vaping Use   Vaping status: Never Used  Substance and Sexual Activity   Alcohol use: Not Currently     Comment: rarely   Drug use: Never   Sexual activity: Yes    Birth control/protection: None  Other Topics Concern   Not on file  Social History Narrative   Not on file   Social Drivers of Health   Financial Resource Strain: Low Risk  (08/28/2023)   Overall Financial Resource Strain (CARDIA)    Difficulty of Paying Living Expenses: Not very hard  Food Insecurity: No Food Insecurity (08/28/2023)   Hunger Vital Sign    Worried About Running Out of Food in the Last Year: Never true    Ran Out of Food in the Last Year: Never true  Transportation Needs: No Transportation Needs (08/28/2023)   PRAPARE - Administrator, Civil Service (Medical): No    Lack of Transportation (Non-Medical): No  Physical Activity: Inactive (08/28/2023)   Exercise Vital Sign    Days of Exercise per Week: 0 days    Minutes of Exercise per Session: 0 min  Stress: No Stress Concern Present (08/28/2023)   [employer] of Occupational Health - Occupational Stress Questionnaire    Feeling of Stress : Not at all  Social Connections: Moderately Integrated (08/28/2023)   Social Connection and Isolation Panel [NHANES]    Frequency of Communication with Friends and Family: Once a week    Frequency of Social Gatherings with Friends and Family: Once a week    Attends Religious Services: More  than 4 times per year    Active Member of Clubs or Organizations: Yes    Attends Banker Meetings: 1 to 4 times per year    Marital Status: Married  Catering manager Violence: Not At Risk (08/28/2023)   Humiliation, Afraid, Rape, and Kick questionnaire    Fear of Current or Ex-Partner: No    Emotionally Abused: No    Physically Abused: No    Sexually Abused: No   Family History  Problem Relation Age of Onset   Cancer Mother    No Known Allergies  Review of Systems  Constitutional:  Negative for chills and fever.  HENT: Negative.    Eyes: Negative.   Respiratory:  Negative for shortness of breath.    Cardiovascular:  Negative for chest pain.  Gastrointestinal:  Positive for nausea. Negative for abdominal pain, diarrhea and vomiting.  Genitourinary: Negative.   Musculoskeletal: Negative.   Skin: Negative.   Neurological: Negative.   Endo/Heme/Allergies: Negative.   Psychiatric/Behavioral: Negative.        Objective:     BP 111/77 (BP Location: Left Arm, Patient Position: Sitting, Cuff Size: Normal)   Pulse 75   Ht 5\' 5"  (1.651 m)   Wt 137 lb (62.1 kg)   LMP 09/04/2023   SpO2 97%   BMI 22.80 kg/m  BP Readings from Last 3 Encounters:  09/24/23 111/77  08/28/23 98/68  06/03/23 108/66   Wt Readings from Last 3 Encounters:  09/24/23 137 lb (62.1 kg)  08/28/23 139 lb (63 kg)  06/03/23 136 lb (61.7 kg)    Physical Exam Vitals and nursing note reviewed.    GENERAL: Alert, cooperative, well developed, no acute distress. HEENT: Normocephalic, normal oropharynx, moist mucous membranes. CHEST: Clear to auscultation bilaterally, no wheezes, rhonchi, or crackles. CARDIOVASCULAR: Normal heart rate and rhythm, S1 and S2 normal without murmurs. ABDOMEN: Soft, non-tender, non-distended, without organomegaly, normal bowel sounds. EXTREMITIES: No cyanosis or edema. NEUROLOGICAL: Cranial nerves grossly intact, moves all extremities without gross motor or sensory deficit. Nail bed and nail tender to touch on left middle and right thumb, see photos.  No drainage noted. Slight swelling        Assessment & Plan:   Problem List Items Addressed This Visit   None Visit Diagnoses       Infection of nail bed of finger of left hand    -  Primary   Relevant Medications   doxycycline  (VIBRAMYCIN ) 100 MG capsule     Infection of nail bed of finger of right hand       Relevant Medications   doxycycline  (VIBRAMYCIN ) 100 MG capsule     Nausea without vomiting       Relevant Medications   ondansetron  (ZOFRAN -ODT) 4 MG disintegrating tablet   Other Relevant Orders   TSH      Thrombocytopenia (HCC)       Relevant Orders   CBC with Differential/Platelet     Encounter for screening mammogram for malignant neoplasm of breast       Relevant Orders   MM DIGITAL SCREENING BILATERAL      1. Infection of nail bed of finger of left hand (Primary) Nail infection on right thumb / left middle  likely from artificial nails. Pain persists - Prescribe doxycycline  100 mg twice daily for 10 days. Patient education given on supportive care, red flags for prompt reevaluation  - doxycycline  (VIBRAMYCIN ) 100 MG capsule; Take 1 capsule (100 mg total) by mouth 2 (two) times daily.  Dispense: 20 capsule; Refill: 0  2. Infection of nail bed of finger of right hand  - doxycycline  (VIBRAMYCIN ) 100 MG capsule; Take 1 capsule (100 mg total) by mouth 2 (two) times daily.  Dispense: 20 capsule; Refill: 0  3. Nausea without vomiting Trial zofran , patient education given on supportive care  - TSH - ondansetron  (ZOFRAN -ODT) 4 MG disintegrating tablet; Take 1 tablet (4 mg total) by mouth every 8 (eight) hours as needed for nausea or vomiting.  Dispense: 20 tablet; Refill: 0  4. Thrombocytopenia (HCC)  - CBC with Differential/Platelet  5. Encounter for screening mammogram for malignant neoplasm of breast  - MM DIGITAL SCREENING BILATERAL; Future   I have reviewed the patient's medical history (PMH, PSH, Social History, Family History, Medications, and allergies) , and have been updated if relevant. I spent 30 minutes reviewing chart and  face to face time with patient.    Return if symptoms worsen or fail to improve.    Etter Hermann Mayers, PA-C

## 2023-09-25 ENCOUNTER — Ambulatory Visit: Payer: Self-pay | Admitting: Physician Assistant

## 2023-09-25 LAB — CBC WITH DIFFERENTIAL/PLATELET
Basophils Absolute: 0.1 10*3/uL (ref 0.0–0.2)
Basos: 1 %
EOS (ABSOLUTE): 0 10*3/uL (ref 0.0–0.4)
Eos: 1 %
Hematocrit: 42.5 % (ref 34.0–46.6)
Hemoglobin: 13.7 g/dL (ref 11.1–15.9)
Immature Grans (Abs): 0 10*3/uL (ref 0.0–0.1)
Immature Granulocytes: 0 %
Lymphocytes Absolute: 1.7 10*3/uL (ref 0.7–3.1)
Lymphs: 33 %
MCH: 29.1 pg (ref 26.6–33.0)
MCHC: 32.2 g/dL (ref 31.5–35.7)
MCV: 90 fL (ref 79–97)
Monocytes Absolute: 0.3 10*3/uL (ref 0.1–0.9)
Monocytes: 5 %
Neutrophils Absolute: 3 10*3/uL (ref 1.4–7.0)
Neutrophils: 60 %
Platelets: 155 10*3/uL (ref 150–450)
RBC: 4.7 x10E6/uL (ref 3.77–5.28)
RDW: 12.4 % (ref 11.7–15.4)
WBC: 5.1 10*3/uL (ref 3.4–10.8)

## 2023-09-25 LAB — TSH: TSH: 0.491 u[IU]/mL (ref 0.450–4.500)

## 2023-10-07 ENCOUNTER — Other Ambulatory Visit: Payer: Self-pay | Admitting: Nurse Practitioner

## 2023-10-07 ENCOUNTER — Telehealth: Payer: Self-pay | Admitting: Nurse Practitioner

## 2023-10-07 DIAGNOSIS — L03019 Cellulitis of unspecified finger: Secondary | ICD-10-CM

## 2023-10-07 NOTE — Telephone Encounter (Signed)
 Copied from CRM [id number]. Topic: Clinical - Medication Question >> Oct 07, 2023  9:39 AM Bridgette Campus T wrote:  Reason for CRM: finished antibiotic but finger is not better, still swollen, please call to advise [phone]

## 2023-10-07 NOTE — Telephone Encounter (Signed)
Referral placed to hand specialist.  

## 2023-10-07 NOTE — Telephone Encounter (Signed)
 Patient identified by name and date of birth.  Patient given information to contact hand specialist. Patient is requesting a cream or another form of treatment if she can not be seen before she leaves the country.

## 2023-10-10 ENCOUNTER — Ambulatory Visit: Admitting: Orthopedic Surgery

## 2023-10-11 ENCOUNTER — Ambulatory Visit
Admission: RE | Admit: 2023-10-11 | Discharge: 2023-10-11 | Disposition: A | Discharge status: Home / Self Care | Source: Ambulatory Visit | Attending: Physician Assistant | Admitting: Physician Assistant

## 2023-10-11 DIAGNOSIS — Z1231 Encounter for screening mammogram for malignant neoplasm of breast: Secondary | ICD-10-CM

## 2023-10-14 ENCOUNTER — Ambulatory Visit

## 2023-11-18 ENCOUNTER — Ambulatory Visit: Admitting: Orthopedic Surgery

## 2024-03-06 ENCOUNTER — Ambulatory Visit: Attending: Family Medicine

## 2024-03-06 DIAGNOSIS — Z23 Encounter for immunization: Secondary | ICD-10-CM

## 2024-03-06 NOTE — Progress Notes (Signed)
Flu vaccine administered per protocols.  Information sheet given. Patient denies and pain or discomfort at injection site. Tolerated injection well no reaction.  

## 2024-03-09 ENCOUNTER — Encounter: Payer: Self-pay | Admitting: Radiology

## 2024-03-24 ENCOUNTER — Encounter: Payer: Self-pay | Admitting: Family Medicine

## 2024-03-24 ENCOUNTER — Telehealth: Payer: Self-pay | Admitting: Family Medicine

## 2024-03-24 ENCOUNTER — Ambulatory Visit: Attending: Family Medicine | Admitting: Family Medicine

## 2024-03-24 VITALS — BP 105/69 | HR 62 | Temp 98.2°F | Ht 65.0 in | Wt 135.4 lb

## 2024-03-24 DIAGNOSIS — F419 Anxiety disorder, unspecified: Secondary | ICD-10-CM | POA: Diagnosis not present

## 2024-03-24 DIAGNOSIS — Z136 Encounter for screening for cardiovascular disorders: Secondary | ICD-10-CM

## 2024-03-24 DIAGNOSIS — R002 Palpitations: Secondary | ICD-10-CM | POA: Diagnosis not present

## 2024-03-24 DIAGNOSIS — Z13228 Encounter for screening for other metabolic disorders: Secondary | ICD-10-CM | POA: Diagnosis not present

## 2024-03-24 DIAGNOSIS — Z13220 Encounter for screening for lipoid disorders: Secondary | ICD-10-CM

## 2024-03-24 DIAGNOSIS — B351 Tinea unguium: Secondary | ICD-10-CM | POA: Diagnosis not present

## 2024-03-24 MED ORDER — HYDROXYZINE HCL 25 MG PO TABS: 25.0000 mg | ORAL_TABLET | Freq: Three times a day (TID) | ORAL | 1 refills | Status: AC | PRN

## 2024-03-24 MED ORDER — TERBINAFINE HCL 250 MG PO TABS: 250.0000 mg | ORAL_TABLET | Freq: Every day | ORAL | 2 refills | Status: AC

## 2024-03-24 NOTE — Telephone Encounter (Signed)
 PCP had referred this patient for sleep study early on in the year but she missed this due to travel.  Can you please assist her in scheduling and provide her with an update?  Thank you.

## 2024-03-24 NOTE — Progress Notes (Signed)
 Subjective:  Patient ID: Laura Warren, female    DOB: 05/12/1980  Age: 43 y.o. MRN: [id number]  CC: Shortness of Breath (Nail concerns)     Discussed the use of AI scribe software for clinical note transcription with the patient, who gave verbal consent to proceed.  History of Present Illness Laura Warren is a 43 year old female patient of Zelda Fleming, FNP, with a history of hyperlipidemia who presents with episodes of waking up with shortness of breath and palpitations.  She experiences these episodes variably, sometimes daily and other times a few times a week. Symptoms include a sensation of 'body shaking' and difficulty breathing, lasting a few minutes before resolving. There is no history of anxiety or asthma, and she does not snore during sleep. She missed a previously scheduled sleep study appointment due to travel.  She underwent an EKG in January 2025 and a Holter monitor test in May 2023. She was previously referred for a sleep study but did not attend the appointment.  She has issues with her right thumb nail, which has been problematic since an infection since she had paronychia 6 months ago.  The nail remains rough and discolored, with pain during manicures. No previous discoloration or destruction of the nail prior to the infection.  She is concerned about her cholesterol levels and wants to have them rechecked.    Past Medical History:  Diagnosis Date   Medical history non-contributory     Past Surgical History:  Procedure Laterality Date   APPENDECTOMY      Family History  Problem Relation Age of Onset   Cancer Mother     Social History   Socioeconomic History   Marital status: Married    Spouse name: Deepak   Number of children: 2   Years of education: Not on file   Highest education level: Not on file  Occupational History   Not on file  Tobacco Use   Smoking status: Never   Smokeless tobacco: Never  Vaping Use   Vaping status: Never  Used  Substance and Sexual Activity   Alcohol use: Not Currently    Comment: rarely   Drug use: Never   Sexual activity: Yes    Birth control/protection: None  Other Topics Concern   Not on file  Social History Narrative   Not on file   Social Drivers of Health   Financial Resource Strain: Low Risk  (08/28/2023)   Overall Financial Resource Strain (CARDIA)    Difficulty of Paying Living Expenses: Not very hard  Food Insecurity: No Food Insecurity (08/28/2023)   Hunger Vital Sign    Worried About Running Out of Food in the Last Year: Never true    Ran Out of Food in the Last Year: Never true  Transportation Needs: No Transportation Needs (08/28/2023)   PRAPARE - Administrator, Civil Service (Medical): No    Lack of Transportation (Non-Medical): No  Physical Activity: Inactive (08/28/2023)   Exercise Vital Sign    Days of Exercise per Week: 0 days    Minutes of Exercise per Session: 0 min  Stress: No Stress Concern Present (08/28/2023)   [employer] of Occupational Health - Occupational Stress Questionnaire    Feeling of Stress : Not at all  Social Connections: Moderately Integrated (08/28/2023)   Social Connection and Isolation Panel    Frequency of Communication with Friends and Family: Once a week    Frequency of Social Gatherings with Friends and Family: Once  a week    Attends Religious Services: More than 4 times per year    Active Member of Clubs or Organizations: Yes    Attends Banker Meetings: 1 to 4 times per year    Marital Status: Married    No Known Allergies  Outpatient Medications Prior to Visit  Medication Sig Dispense Refill   atorvastatin  (LIPITOR) 10 MG tablet Take 1 tablet (10 mg total) by mouth daily. 90 tablet 3   benzonatate  (TESSALON ) 100 MG capsule Take 1 capsule (100 mg total) by mouth 2 (two) times daily as needed for cough. (Patient not taking: Reported on 03/24/2024) 40 capsule 0   doxycycline  (VIBRAMYCIN ) 100 MG  capsule Take 1 capsule (100 mg total) by mouth 2 (two) times daily. (Patient not taking: Reported on 03/24/2024) 20 capsule 0   famotidine  (PEPCID ) 40 MG tablet TAKE 1 TABLET(40 MG) BY MOUTH DAILY (Patient not taking: Reported on 03/24/2024) 30 tablet 1   fluticasone  (FLONASE ) 50 MCG/ACT nasal spray Place 2 sprays into both nostrils daily. (Patient not taking: Reported on 03/24/2024) 16 g 6   methocarbamol  (ROBAXIN ) 500 MG tablet Take 2 tablets (1,000 mg total) by mouth every 8 (eight) hours as needed. (Patient not taking: Reported on 03/24/2024) 90 tablet 0   ondansetron  (ZOFRAN -ODT) 4 MG disintegrating tablet Take 1 tablet (4 mg total) by mouth every 8 (eight) hours as needed for nausea or vomiting. (Patient not taking: Reported on 03/24/2024) 20 tablet 0   No facility-administered medications prior to visit.     ROS Review of Systems  Constitutional:  Negative for activity change and appetite change.  HENT:  Negative for sinus pressure and sore throat.   Respiratory:  Positive for shortness of breath. Negative for chest tightness and wheezing.   Cardiovascular:  Positive for palpitations. Negative for chest pain.  Gastrointestinal:  Negative for abdominal distention, abdominal pain and constipation.  Genitourinary: Negative.   Musculoskeletal: Negative.   Psychiatric/Behavioral:  Negative for behavioral problems and dysphoric mood.     Objective:  BP 105/69   Pulse 62   Temp 98.2 F (36.8 C) (Oral)   Ht 5' 5 (1.651 m)   Wt 135 lb 6.4 oz (61.4 kg)   SpO2 100%   BMI 22.53 kg/m      03/24/2024   10:32 AM 09/24/2023   11:33 AM 08/28/2023    8:41 AM  BP/Weight  Systolic BP 105 111 98  Diastolic BP 69 77 68  Wt. (Lbs) 135.4 137 139  BMI 22.53 kg/m2 22.8 kg/m2 23.13 kg/m2      Physical Exam Constitutional:      Appearance: She is well-developed.  Cardiovascular:     Rate and Rhythm: Normal rate.     Heart sounds: Normal heart sounds. No murmur heard. Pulmonary:      Effort: Pulmonary effort is normal.     Breath sounds: Normal breath sounds. No wheezing or rales.  Chest:     Chest wall: No tenderness.  Abdominal:     General: Bowel sounds are normal. There is no distension.     Palpations: Abdomen is soft. There is no mass.     Tenderness: There is no abdominal tenderness.  Musculoskeletal:        General: Normal range of motion.     Right lower leg: No edema.     Left lower leg: No edema.  Skin:    Comments: Roughness of base of right thumb and also base of ring finger of  left hand  Neurological:     Mental Status: She is alert and oriented to person, place, and time.  Psychiatric:        Mood and Affect: Mood normal.        Latest Ref Rng & Units 08/28/2023    9:10 AM 06/03/2023    6:33 AM 05/16/2023    4:49 PM  CMP  Glucose 70 - 99 mg/dL  96  872   BUN 6 - 20 mg/dL  12  10   Creatinine 9.55 - 1.00 mg/dL  9.23  9.25   Sodium 864 - 145 mmol/L  134  139   Potassium 3.5 - 5.1 mmol/L  3.5  4.0   Chloride 98 - 111 mmol/L  104  99   CO2 22 - 32 mmol/L  22  23   Calcium  8.9 - 10.3 mg/dL  8.7  9.3   Total Protein 6.0 - 8.5 g/dL 6.5   6.9   Total Bilirubin 0.0 - 1.2 mg/dL 0.8   0.4   Alkaline Phos 44 - 121 IU/L 35   40   AST 0 - 40 IU/L 19   21   ALT 0 - 32 IU/L 14   18     Lipid Panel     Component Value Date/Time   CHOL 194 08/28/2023 0910   TRIG 122 08/28/2023 0910   HDL 47 08/28/2023 0910   CHOLHDL 4.1 08/28/2023 0910   CHOLHDL 3.9 07/24/2013 0947   VLDL 16 07/24/2013 0947   LDLCALC 125 (H) 08/28/2023 0910    CBC    Component Value Date/Time   WBC 5.1 09/24/2023 1915   WBC 8.0 06/03/2023 0633   RBC 4.70 09/24/2023 1915   RBC 4.37 06/03/2023 0633   HGB 13.7 09/24/2023 1915   HCT 42.5 09/24/2023 1915   PLT 155 09/24/2023 1915   MCV 90 09/24/2023 1915   MCH 29.1 09/24/2023 1915   MCH 29.3 06/03/2023 0633   MCHC 32.2 09/24/2023 1915   MCHC 33.8 06/03/2023 0633   RDW 12.4 09/24/2023 1915   LYMPHSABS 1.7 09/24/2023 1915    MONOABS 0.6 06/03/2023 0633   EOSABS 0.0 09/24/2023 1915   BASOSABS 0.1 09/24/2023 1915    No results found for: HGBA1C   Lab Results  Component Value Date   TSH 0.491 09/24/2023       Assessment & Plan Palpitations Intermittent palpitations and nocturnal shortness of breath suggest anxiety or sleep apnea. Previous cardiac and thyroid  evaluations were normal. - Ordered EKG to reassess cardiac status -EKG reveals sinus bradycardia - She has previously undergone a Holter monitor 2 years ago which was unrevealing and she declined referral to cardiology at this time. - Prescribed hydroxyzine for anxiety-related symptoms. - Referred for sleep study to evaluate for sleep apnea by PCP but this was not completed - Will have CMA assist her in scheduling this.   Anxiety Symptoms suggestive of anxiety, potentially contributing to nocturnal episodes. - Prescribed hydroxyzine for anxiety-related symptoms.  Onychomycosis, right thumb Chronic nail changes and pain suggest fungal infection. - Prescribed terbinafine for onychomycosis. - Referred to dermatologist for further evaluation.  General Health Maintenance Requested cholesterol recheck. Encounter for screening for lipid for cardiovascular disease/encounter for metabolic screening- Ordered comprehensive metabolic panel and cholesterol test.      Meds ordered this encounter  Medications   terbinafine (LAMISIL) 250 MG tablet    Sig: Take 1 tablet (250 mg total) by mouth daily.    Dispense:  30  tablet    Refill:  2   hydrOXYzine (ATARAX) 25 MG tablet    Sig: Take 1 tablet (25 mg total) by mouth 3 (three) times daily as needed.    Dispense:  60 tablet    Refill:  1    Follow-up: Return in about 3 months (around 06/24/2024) for Medical conditions with PCP.       Corrina Sabin, MD, FAAFP. [facility] [place], [place] [phone]   03/24/2024, 12:36 PM

## 2024-03-24 NOTE — Patient Instructions (Signed)
 VISIT SUMMARY:  During your visit, we discussed your episodes of waking up with shortness of breath and palpitations, issues with your right thumb nail, and your concerns about cholesterol levels. We have planned further evaluations and treatments to address these concerns.  YOUR PLAN:  -PALPITATIONS AND NOCTURNAL SHORTNESS OF BREATH: Your symptoms of waking up with shortness of breath and palpitations may be related to anxiety or sleep apnea. We have ordered an EKG to reassess your heart health, prescribed hydroxyzine to help with anxiety-related symptoms, and referred you for a sleep study to evaluate for sleep apnea. If the EKG results are normal, we may refer you to a cardiologist for further evaluation.  -ANXIETY: Anxiety can cause symptoms like palpitations and shortness of breath. We have prescribed hydroxyzine to help manage these symptoms.  -ONYCHOMYCOSIS, RIGHT THUMB: Onychomycosis is a fungal infection of the nail, which can cause changes in the nail's appearance and pain. We have prescribed terbinafine to treat the infection and referred you to a dermatologist for further evaluation.  -GENERAL HEALTH MAINTENANCE: We have ordered a comprehensive metabolic panel and cholesterol test to recheck your cholesterol levels and assess your overall health.  INSTRUCTIONS:  Please follow up with the sleep study appointment as scheduled. Take the prescribed medications as directed. Attend the dermatologist referral for your right thumb nail. We will review the results of your EKG and cholesterol tests during your next visit.

## 2024-03-25 ENCOUNTER — Ambulatory Visit: Payer: Self-pay | Admitting: Family Medicine

## 2024-03-25 LAB — LP+NON-HDL CHOLESTEROL
Cholesterol, Total: 227 mg/dL — ABNORMAL HIGH (ref 100–199)
HDL: 55 mg/dL (ref >39)
LDL Chol Calc (NIH): 143 mg/dL — ABNORMAL HIGH (ref 0–99)
Total Non-HDL-Chol (LDL+VLDL): 172 mg/dL — ABNORMAL HIGH (ref 0–129)
Triglycerides: 163 mg/dL — ABNORMAL HIGH (ref 0–149)
VLDL Cholesterol Cal: 29 mg/dL (ref 5–40)

## 2024-03-25 LAB — CMP14+EGFR
ALT: 12 IU/L (ref 0–32)
AST: 15 IU/L (ref 0–40)
Albumin: 4.4 g/dL (ref 3.9–4.9)
Alkaline Phosphatase: 35 IU/L — ABNORMAL LOW (ref 41–116)
BUN/Creatinine Ratio: 13 (ref 9–23)
BUN: 10 mg/dL (ref 6–24)
Bilirubin Total: 0.5 mg/dL (ref 0.0–1.2)
CO2: 24 mmol/L (ref 20–29)
Calcium: 9.2 mg/dL (ref 8.7–10.2)
Chloride: 102 mmol/L (ref 96–106)
Creatinine, Ser: 0.76 mg/dL (ref 0.57–1.00)
Globulin, Total: 2.7 g/dL (ref 1.5–4.5)
Glucose: 81 mg/dL (ref 70–99)
Potassium: 3.9 mmol/L (ref 3.5–5.2)
Sodium: 138 mmol/L (ref 134–144)
Total Protein: 7.1 g/dL (ref 6.0–8.5)
eGFR: 100 mL/min/1.73 (ref >59)

## 2024-03-25 NOTE — Telephone Encounter (Signed)
 Noted, mychart message sent with contact information for patient to call and schedule sleep study.

## 2024-04-20 ENCOUNTER — Other Ambulatory Visit (HOSPITAL_COMMUNITY)
Admission: RE | Admit: 2024-04-20 | Discharge: 2024-04-20 | Disposition: A | Discharge status: Home / Self Care | Source: Ambulatory Visit | Attending: Nurse Practitioner | Admitting: Nurse Practitioner

## 2024-04-20 ENCOUNTER — Encounter: Payer: Self-pay | Admitting: Nurse Practitioner

## 2024-04-20 ENCOUNTER — Ambulatory Visit: Attending: Nurse Practitioner | Admitting: Nurse Practitioner

## 2024-04-20 VITALS — BP 113/78 | HR 72 | Ht 65.0 in | Wt 137.9 lb

## 2024-04-20 DIAGNOSIS — Z124 Encounter for screening for malignant neoplasm of cervix: Secondary | ICD-10-CM

## 2024-04-20 DIAGNOSIS — E7800 Pure hypercholesterolemia, unspecified: Secondary | ICD-10-CM

## 2024-04-20 DIAGNOSIS — Z0000 Encounter for general adult medical examination without abnormal findings: Secondary | ICD-10-CM | POA: Diagnosis not present

## 2024-04-20 DIAGNOSIS — B351 Tinea unguium: Secondary | ICD-10-CM | POA: Diagnosis not present

## 2024-04-20 MED ORDER — CICLOPIROX 8 % EX SOLN: Freq: Every day | CUTANEOUS | 1 refills | Status: AC

## 2024-04-20 NOTE — Progress Notes (Signed)
 Assessment & Plan:  Laura Warren was seen today for annual exam.  Diagnoses and all orders for this visit:  Encounter for Papanicolaou smear of cervix -     Cervicovaginal ancillary only -     Cytology - PAP  Hypercholesterolemia -     Lipid panel -     CMP14+EGFR INSTRUCTIONS: Work on a low fat, heart healthy diet and participate in regular aerobic exercise program by working out at least 150 minutes per week; 5 days a week-30 minutes per day. Avoid red meat/beef/steak,  fried foods. junk foods, sodas, sugary drinks, unhealthy snacking, alcohol and smoking.  Drink at least 80 oz of water per day and monitor your carbohydrate intake daily.    Encounter for annual physical exam  Tinea unguium -     Ambulatory referral to Dermatology -     ciclopirox  (PENLAC ) 8 % solution; Apply topically at bedtime. Apply over nail and surrounding skin. Apply daily over previous coat. After seven (7) days, may remove with alcohol and continue cycle. States lamisil  caused intolerable GI symptoms   Patient has been counseled on age-appropriate routine health concerns for screening and prevention. These are reviewed and up-to-date. Referrals have been placed accordingly. Immunizations are up-to-date or declined.    Subjective:   Chief Complaint  Patient presents with   Annual Exam    Laura Warren 43 y.o. female presents to office today    HPI  Review of Systems  Constitutional: Negative.  Negative for chills, fever, malaise/fatigue and weight loss.  HENT: Negative.  Negative for nosebleeds.   Eyes: Negative.  Negative for blurred vision, double vision and photophobia.  Respiratory: Negative.  Negative for cough, sputum production, shortness of breath and wheezing.   Cardiovascular: Negative.  Negative for chest pain, palpitations and leg swelling.  Gastrointestinal: Negative.  Negative for abdominal pain, blood in stool, constipation, diarrhea, heartburn, melena, nausea and vomiting.   Genitourinary: Negative.   Musculoskeletal: Negative.  Negative for myalgias.  Skin:  Positive for rash.  Neurological: Negative.  Negative for dizziness, tremors, speech change, focal weakness, seizures and headaches.  Endo/Heme/Allergies: Negative.   Psychiatric/Behavioral: Negative.  Negative for depression and suicidal ideas. The patient is not nervous/anxious and does not have insomnia.     Past Medical History:  Diagnosis Date   Medical history non-contributory     Past Surgical History:  Procedure Laterality Date   APPENDECTOMY      Family History  Problem Relation Age of Onset   Cancer Mother     Social History Reviewed with no changes to be made today.   Outpatient Medications Prior to Visit  Medication Sig Dispense Refill   atorvastatin  (LIPITOR) 10 MG tablet Take 1 tablet (10 mg total) by mouth daily. 90 tablet 3   benzonatate  (TESSALON ) 100 MG capsule Take 1 capsule (100 mg total) by mouth 2 (two) times daily as needed for cough. (Patient not taking: Reported on 04/20/2024) 40 capsule 0   doxycycline  (VIBRAMYCIN ) 100 MG capsule Take 1 capsule (100 mg total) by mouth 2 (two) times daily. (Patient not taking: Reported on 04/20/2024) 20 capsule 0   famotidine  (PEPCID ) 40 MG tablet TAKE 1 TABLET(40 MG) BY MOUTH DAILY (Patient not taking: Reported on 04/20/2024) 30 tablet 1   fluticasone  (FLONASE ) 50 MCG/ACT nasal spray Place 2 sprays into both nostrils daily. (Patient not taking: Reported on 04/20/2024) 16 g 6   hydrOXYzine  (ATARAX ) 25 MG tablet Take 1 tablet (25 mg total) by mouth 3 (three) times  daily as needed. (Patient not taking: Reported on 04/20/2024) 60 tablet 1   methocarbamol  (ROBAXIN ) 500 MG tablet Take 2 tablets (1,000 mg total) by mouth every 8 (eight) hours as needed. (Patient not taking: Reported on 04/20/2024) 90 tablet 0   ondansetron  (ZOFRAN -ODT) 4 MG disintegrating tablet Take 1 tablet (4 mg total) by mouth every 8 (eight) hours as needed for nausea or  vomiting. (Patient not taking: Reported on 04/20/2024) 20 tablet 0   terbinafine  (LAMISIL ) 250 MG tablet Take 1 tablet (250 mg total) by mouth daily. (Patient not taking: Reported on 04/20/2024) 30 tablet 2   No facility-administered medications prior to visit.    Allergies[1]     Objective:    BP 113/78 (BP Location: Left Arm, Patient Position: Sitting, Cuff Size: Normal)   Pulse 72   Ht 5' 5 (1.651 m)   Wt 137 lb 14.4 oz (62.6 kg)   LMP 04/02/2024 (Exact Date)   SpO2 100%   BMI 22.95 kg/m  Wt Readings from Last 3 Encounters:  04/20/24 137 lb 14.4 oz (62.6 kg)  03/24/24 135 lb 6.4 oz (61.4 kg)  09/24/23 137 lb (62.1 kg)    Physical Exam Exam conducted with a chaperone present.  Constitutional:      Appearance: She is well-developed.  HENT:     Head: Normocephalic and atraumatic.     Right Ear: Hearing, tympanic membrane, ear canal and external ear normal.     Left Ear: Hearing, tympanic membrane, ear canal and external ear normal.     Nose: Nose normal.     Right Turbinates: Not enlarged.     Left Turbinates: Not enlarged.     Mouth/Throat:     Lips: Pink.     Mouth: Mucous membranes are moist.     Dentition: No dental tenderness, gingival swelling, dental abscesses or gum lesions.     Pharynx: No oropharyngeal exudate.  Eyes:     General: No scleral icterus.       Right eye: No discharge.     Extraocular Movements: Extraocular movements intact.     Conjunctiva/sclera: Conjunctivae normal.     Pupils: Pupils are equal, round, and reactive to light.  Neck:     Thyroid : No thyromegaly.     Trachea: No tracheal deviation.  Cardiovascular:     Rate and Rhythm: Normal rate and regular rhythm.     Heart sounds: Normal heart sounds. No murmur heard.    No friction rub.  Pulmonary:     Effort: Pulmonary effort is normal. No accessory muscle usage or respiratory distress.     Breath sounds: Normal breath sounds. No decreased breath sounds, wheezing, rhonchi or rales.   Abdominal:     General: Bowel sounds are normal. There is no distension.     Palpations: Abdomen is soft. There is no mass.     Tenderness: There is no abdominal tenderness. There is no right CVA tenderness, left CVA tenderness, guarding or rebound.     Hernia: No hernia is present. There is no hernia in the left inguinal area.  Genitourinary:    Exam position: Lithotomy position.     Labia:        Right: No rash, tenderness, lesion or injury.        Left: No rash, tenderness, lesion or injury.      Vagina: Normal. No signs of injury and foreign body. No vaginal discharge, erythema, tenderness or bleeding.     Cervix: Normal.  Uterus: Not deviated and not enlarged.      Adnexa:        Right: No mass, tenderness or fullness.         Left: No mass, tenderness or fullness.       Rectum: Normal. No external hemorrhoid.  Musculoskeletal:        General: No tenderness or deformity. Normal range of motion.     Cervical back: Normal range of motion and neck supple.  Lymphadenopathy:     Cervical: No cervical adenopathy.     Lower Body: No right inguinal adenopathy. No left inguinal adenopathy.  Skin:    General: Skin is warm and dry.     Findings: No erythema.     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     Cranial Nerves: No cranial nerve deficit.     Motor: Motor function is intact.     Coordination: Coordination is intact. Coordination normal.     Gait: Gait is intact.     Deep Tendon Reflexes:     Reflex Scores:      Patellar reflexes are 1+ on the right side and 1+ on the left side. Psychiatric:        Attention and Perception: Attention normal.        Mood and Affect: Mood normal.        Speech: Speech normal.        Behavior: Behavior normal.        Thought Content: Thought content  normal.        Judgment: Judgment normal.          Patient has been counseled extensively about nutrition and exercise as well as the importance of adherence with medications and regular follow-up. The patient was given clear instructions to go to ER or return to medical center if symptoms don't improve, worsen or new problems develop. The patient verbalized understanding.   Follow-up: Return if symptoms worsen or fail to improve.   Laura LELON Servant, FNP-BC [facility] [place], [place] [phone]   04/20/2024, 10:18 AM    [1] No Known Allergies

## 2024-04-21 LAB — CMP14+EGFR
ALT: 10 IU/L (ref 0–32)
AST: 20 IU/L (ref 0–40)
Albumin: 4.5 g/dL (ref 3.9–4.9)
Alkaline Phosphatase: 33 IU/L — ABNORMAL LOW (ref 41–116)
BUN/Creatinine Ratio: 17 (ref 9–23)
BUN: 14 mg/dL (ref 6–24)
Bilirubin Total: 0.6 mg/dL (ref 0.0–1.2)
CO2: 22 mmol/L (ref 20–29)
Calcium: 9.8 mg/dL (ref 8.7–10.2)
Chloride: 102 mmol/L (ref 96–106)
Creatinine, Ser: 0.82 mg/dL (ref 0.57–1.00)
Globulin, Total: 2.5 g/dL (ref 1.5–4.5)
Glucose: 93 mg/dL (ref 70–99)
Potassium: 4.2 mmol/L (ref 3.5–5.2)
Sodium: 138 mmol/L (ref 134–144)
Total Protein: 7 g/dL (ref 6.0–8.5)
eGFR: 91 mL/min/1.73 (ref >59)

## 2024-04-21 LAB — CERVICOVAGINAL ANCILLARY ONLY
Bacterial Vaginitis (gardnerella): NEGATIVE
Candida Glabrata: NEGATIVE
Candida Vaginitis: POSITIVE — AB
Chlamydia: NEGATIVE
Comment: NEGATIVE
Comment: NEGATIVE
Comment: NEGATIVE
Comment: NEGATIVE
Comment: NEGATIVE
Comment: NORMAL
Neisseria Gonorrhea: NEGATIVE
Trichomonas: NEGATIVE

## 2024-04-21 LAB — LIPID PANEL
Chol/HDL Ratio: 4.1 ratio (ref 0.0–4.4)
Cholesterol, Total: 214 mg/dL — ABNORMAL HIGH (ref 100–199)
HDL: 52 mg/dL (ref >39)
LDL Chol Calc (NIH): 139 mg/dL — ABNORMAL HIGH (ref 0–99)
Triglycerides: 129 mg/dL (ref 0–149)
VLDL Cholesterol Cal: 23 mg/dL (ref 5–40)

## 2024-04-23 LAB — CYTOLOGY - PAP
Chlamydia: NEGATIVE
Comment: NEGATIVE
Comment: NEGATIVE
Comment: NORMAL
Diagnosis: NEGATIVE
High risk HPV: NEGATIVE
Neisseria Gonorrhea: NEGATIVE

## 2024-04-25 ENCOUNTER — Ambulatory Visit: Payer: Self-pay | Admitting: Nurse Practitioner

## 2024-04-25 DIAGNOSIS — B3731 Acute candidiasis of vulva and vagina: Secondary | ICD-10-CM

## 2024-04-25 MED ORDER — FLUCONAZOLE 150 MG PO TABS: 150.0000 mg | ORAL_TABLET | Freq: Once | ORAL | 0 refills | Status: AC

## 2024-06-24 ENCOUNTER — Ambulatory Visit: Admitting: Nurse Practitioner
